# Patient Record
Sex: Male | Born: 1980 | Race: White | Hispanic: No | Marital: Married | Smoking: Former smoker
Health system: Southern US, Community
[De-identification: ages and names within clinical notes are randomized; demographics above are authoritative.]

## PROBLEM LIST (undated history)

## (undated) DIAGNOSIS — R002 Palpitations: Secondary | ICD-10-CM

## (undated) HISTORY — DX: Palpitations: R00.2

---

## 2014-07-10 ENCOUNTER — Emergency Department (HOSPITAL_COMMUNITY)
Admission: EM | Admit: 2014-07-10 | Discharge: 2014-07-11 | Disposition: A | Discharge status: Home / Self Care | Payer: BC Managed Care – PPO | Attending: Emergency Medicine | Admitting: Emergency Medicine

## 2014-07-10 ENCOUNTER — Encounter (HOSPITAL_COMMUNITY): Payer: Self-pay | Admitting: Emergency Medicine

## 2014-07-10 ENCOUNTER — Emergency Department (HOSPITAL_COMMUNITY): Payer: BC Managed Care – PPO

## 2014-07-10 DIAGNOSIS — T44901A Poisoning by unspecified drugs primarily affecting the autonomic nervous system, accidental (unintentional), initial encounter: Secondary | ICD-10-CM

## 2014-07-10 DIAGNOSIS — T44991A Poisoning by other drug primarily affecting the autonomic nervous system, accidental (unintentional), initial encounter: Secondary | ICD-10-CM | POA: Diagnosis not present

## 2014-07-10 DIAGNOSIS — R000 Tachycardia, unspecified: Secondary | ICD-10-CM | POA: Insufficient documentation

## 2014-07-10 DIAGNOSIS — R002 Palpitations: Secondary | ICD-10-CM | POA: Diagnosis present

## 2014-07-10 LAB — BASIC METABOLIC PANEL
Anion gap: 11 (ref 5–15)
BUN: 12 mg/dL (ref 6–23)
CALCIUM: 9.6 mg/dL (ref 8.4–10.5)
CO2: 26 mmol/L (ref 19–32)
CREATININE: 0.99 mg/dL (ref 0.50–1.35)
Chloride: 100 mmol/L (ref 96–112)
Glucose, Bld: 149 mg/dL — ABNORMAL HIGH (ref 70–99)
Potassium: 3.6 mmol/L (ref 3.5–5.1)
SODIUM: 137 mmol/L (ref 135–145)

## 2014-07-10 LAB — CBC
HCT: 43.9 % (ref 39.0–52.0)
Hemoglobin: 15.1 g/dL (ref 13.0–17.0)
MCH: 29.4 pg (ref 26.0–34.0)
MCHC: 34.4 g/dL (ref 30.0–36.0)
MCV: 85.4 fL (ref 78.0–100.0)
Platelets: 269 10*3/uL (ref 150–400)
RBC: 5.14 MIL/uL (ref 4.22–5.81)
RDW: 12.6 % (ref 11.5–15.5)
WBC: 11.5 10*3/uL — ABNORMAL HIGH (ref 4.0–10.5)

## 2014-07-10 LAB — I-STAT TROPONIN, ED: Troponin i, poc: 0 ng/mL (ref 0.00–0.08)

## 2014-07-10 NOTE — ED Notes (Signed)
Pt presents with palpitations throughout the day, denies CP.  Pt reports that he has recently had a chest cold and cough- sinus tach on monitor.  Denies N/V.

## 2014-07-11 LAB — TSH: TSH: 2.24 u[IU]/mL (ref 0.350–4.500)

## 2014-07-11 MED ORDER — SODIUM CHLORIDE 0.9 % IV BOLUS (SEPSIS)
1000.0000 mL | Freq: Once | INTRAVENOUS | Status: AC
  Administered 2014-07-11: 1000 mL via INTRAVENOUS

## 2014-07-11 NOTE — Discharge Instructions (Signed)
Palpitations °A palpitation is the feeling that your heartbeat is irregular or is faster than normal. It may feel like your heart is fluttering or skipping a beat. Palpitations are usually not a serious problem. However, in some cases, you may need further medical evaluation. °CAUSES  °Palpitations can be caused by: °· Smoking. °· Caffeine or other stimulants, such as diet pills or energy drinks. °· Alcohol. °· Stress and anxiety. °· Strenuous physical activity. °· Fatigue. °· Certain medicines. °· Heart disease, especially if you have a history of irregular heart rhythms (arrhythmias), such as atrial fibrillation, atrial flutter, or supraventricular tachycardia. °· An improperly working pacemaker or defibrillator. °DIAGNOSIS  °To find the cause of your palpitations, your health care provider will take your medical history and perform a physical exam. Your health care provider may also have you take a test called an ambulatory electrocardiogram (ECG). An ECG records your heartbeat patterns over a 24-hour period. You may also have other tests, such as: °· Transthoracic echocardiogram (TTE). During echocardiography, sound waves are used to evaluate how blood flows through your heart. °· Transesophageal echocardiogram (TEE). °· Cardiac monitoring. This allows your health care provider to monitor your heart rate and rhythm in real time. °· Holter monitor. This is a portable device that records your heartbeat and can help diagnose heart arrhythmias. It allows your health care provider to track your heart activity for several days, if needed. °· Stress tests by exercise or by giving medicine that makes the heart beat faster. °TREATMENT  °Treatment of palpitations depends on the cause of your symptoms and can vary greatly. Most cases of palpitations do not require any treatment other than time, relaxation, and monitoring your symptoms. Other causes, such as atrial fibrillation, atrial flutter, or supraventricular  tachycardia, usually require further treatment. °HOME CARE INSTRUCTIONS  °· Avoid: °¨ Caffeinated coffee, tea, soft drinks, diet pills, and energy drinks. °¨ Chocolate. °¨ Alcohol. °· Stop smoking if you smoke. °· Reduce your stress and anxiety. Things that can help you relax include: °¨ A method of controlling things in your body, such as your heartbeats, with your mind (biofeedback). °¨ Yoga. °¨ Meditation. °¨ Physical activity such as swimming, jogging, or walking. °· Get plenty of rest and sleep. °SEEK MEDICAL CARE IF:  °· You continue to have a fast or irregular heartbeat beyond 24 hours. °· Your palpitations occur more often. °SEEK IMMEDIATE MEDICAL CARE IF: °· You have chest pain or shortness of breath. °· You have a severe headache. °· You feel dizzy or you faint. °MAKE SURE YOU: °· Understand these instructions. °· Will watch your condition. °· Will get help right away if you are not doing well or get worse. °Document Released: 05/02/2000 Document Revised: 05/10/2013 Document Reviewed: 07/04/2011 °ExitCare® Patient Information ©2015 [employer]. This information is not intended to replace advice given to you by your health care provider. Make sure you discuss any questions you have with your health care provider. ° ° °Emergency Department Resource Guide °1) Find a Doctor and Pay Out of Pocket °Although you won't have to find out who is covered by your insurance plan, it is a good idea to ask around and get recommendations. You will then need to call the office and see if the doctor you have chosen will accept you as a new patient and what types of options they offer for patients who are self-pay. Some doctors offer discounts or will set up payment plans for their patients who do not have insurance, but   you will need to ask so you aren't surprised when you get to your appointment. ° °2) Contact Your Local Health Department °Not all health departments have doctors that can see patients for sick visits, but  many do, so it is worth a call to see if yours does. If you don't know where your local health department is, you can check in your phone book. The CDC also has a tool to help you locate your state's health department, and many state websites also have listings of all of their local health departments. ° °3) Find a Walk-in Clinic °If your illness is not likely to be very severe or complicated, you may want to try a walk in clinic. These are popping up all over the country in pharmacies, drugstores, and shopping centers. They're usually staffed by nurse practitioners or physician assistants that have been trained to treat common illnesses and complaints. They're usually fairly quick and inexpensive. However, if you have serious medical issues or chronic medical problems, these are probably not your best option. ° °No Primary Care Doctor: °- Call [facility] at  832-8000 - they can help you locate a primary care doctor that  accepts your insurance, provides certain services, etc. °- Physician Referral Service- 1-800-533-3463 ° °Chronic Pain Problems: °Organization         Address  Phone   Notes  °[facility]  (336) 297-2271 Patients need to be referred by their primary care doctor.  ° °Medication Assistance: °Organization         Address  Phone   Notes  °[place] [facility] [address] °[place], [place] [address] (336) 641-8030 --Must be a resident of [place] °-- Must have NO insurance coverage whatsoever (no Medicaid/ Medicare, etc.) °-- The pt. MUST have a primary care doctor that directs their care regularly and follows them in the community °  °[facility]  (866) 331-1348   °[employer]  (888) 892-1162   ° °Agencies that provide inexpensive medical care: °Organization         Address  Phone   Notes  °[facility]  (336) 832-8035   °[facility]    (336) 832-7272   °[facility] [address] °[place], [place] [address] (336) 832-4777   °[facility] [address], °[place] (336) 271-4999   °[facility]    (336) 373-0678   °[facility]    (336) 272-1050   °[facility] ° [address], [place] Phone:  (336) 832-4444, Fax:  (336) 832-4440 Hours of Operation:  9 am - 6 pm, M-F.  Also accepts Medicaid/Medicare and self-pay.  °[facility] ° [address], [place] Phone: (336) 832-3150, Fax: (336) 832-3151. Hours of Operation:  8:30 am - 5:30 pm, M-F.  Also accepts Medicaid and self-pay.  °[facility] [place] [address], [place] Phone: (336) 878-6027   °[facility] [address], [place], [place] (336)723-1848, Ext. 123 Mondays & Thursdays: 7-9 AM.  First 15 patients are seen on a first come, first serve basis. °  ° °Medicaid-accepting [place] Providers: ° °Organization         Address  Phone   Notes  °[facility] [address], [place] (336) 641-2100 Also accepts self-pay patients.  °[facility] [address], [place] ° (336) 856-9996   °[facility] [address], [place] (336) 288-8857   °[facility] [address] [place] [address], [place] (336) 299-7000   °Veita Bland [address], [place]  ° (336) 373-1557 Only accepts [place] Access Medicaid patients after they have their name applied to their card.  ° °Self-Pay (no insurance) in [place]: ° °Organization         Address  Phone   Notes  °Sickle Cell Patients, [facility] [address], [place] (336) 832-1970   °[facility] [address], [place] (336) 832-4400   °[facility] ° [address], [place] (336) 992-4800   °[facility]/Dr. Osei-Bonsu ° [address] [place] [address], [place] or  [address], [place] (336) 841-8500 Phone number for both [place] and [place] locations is the same.  °[facility] [address], [place] (336) 299-0000   °[facility] [address] [place] [address], [place] or [address] (336) 852-7530 °(336) 878-2260   °[facility] [address], [place] (336) 350-1642, phone; (336) 294-5005, fax Sees patients 1st and 3rd Saturday of every month.  Must not qualify for public or private insurance (i.e. Medicaid, Medicare, [place] Health Choice, Veterans' Benefits) • Household income should be no more than 200% of the poverty level •The clinic cannot treat you if you are pregnant or think you are pregnant • Sexually transmitted diseases are not treated at the clinic.  ° ° °Dental Care: °Organization         Address  Phone  Notes  °[facility] [address], [place] (336) 641-6152 Accepts children up to age 21 who are enrolled in Medicaid or [place] Health Choice; pregnant women with a Medicaid card; and children who have applied for Medicaid or [place] Health Choice, but were declined, whose parents can pay a reduced fee at time of service.  °[facility] [place]  [address], [place] (336) 641-7733 Accepts children up to age 21 who are enrolled in Medicaid or [place] Health Choice; pregnant women with a Medicaid card; and children who have applied for Medicaid or  Health Choice, but were declined, whose parents can pay a reduced fee at time of service.  °[facility] ° [address], [place] (336) 641-4533 Patients are seen by appointment only. Walk-ins are not accepted. [facility] will see patients 18 years of age and older. °Monday - Tuesday (8am-5pm) °Most Wednesdays (8:30-5pm) °$30 per visit, cash only  °[facility] ° [address], [place] (336) 641-4533 Patients are seen by appointment only. Walk-ins are not accepted. [facility] will see patients 18 years of age and older. °One Wednesday Evening (Monthly: Volunteer Based).  $30 per visit, cash only  °[facility]  (919) 537-3737 for adults; Children under age 4, call Graduate Pediatric Dentistry at (919) [phone]. Children aged 4-14, please call (919) [phone] to request a pediatric application. ° Dental services are provided in all areas of dental care including fillings, crowns and bridges, complete and partial dentures, implants, gum treatment, root canals, and extractions. Preventive care is also provided. Treatment is provided to both adults and children. °Patients are selected via a lottery   and there is often a waiting list. °  °[facility] [address], °[place] ° (336) 763-8833 [email or web address] °  °[facility] [address], [place], [place] (336)723-1848, Ext. 123 Second and Fourth Thursday of each month, opens at 6:30 AM; Clinic ends at 9 AM.  Patients are seen on a first-come first-served basis, and a limited number are seen during each clinic.  ° °[facility] ° [address], [place], [place] (336) 723-7904   Eligibility Requirements °You must have lived in [place], [place], or [place] counties for at least the last three months. °  You cannot be eligible for state or federal sponsored healthcare insurance, including Veterans Administration, Medicaid, or Medicare. °  You generally cannot be eligible for healthcare insurance through your employer.  °  How to apply: °Eligibility screenings are held every Tuesday and Wednesday afternoon from 1:00 pm until 4:00 pm. You do not need an appointment for the interview!  °[facility] [address], [place], [place] 336-631-2330   °[facility]  336-342-8273   °[facility]  336-703-3100   °[facility]  336-570-6415   ° °Behavioral Health Resources in the Community: °Intensive Outpatient Programs °Organization         Address  Phone  Notes  °[facility] [address], [place], [place] 336-878-6098   °[facility] [address], [place], [place] 336-832-9800   °[facility] [address], [place], [place] ° 336-882-2125   °[facility] [address],  °[place], [place] 1-800-853-5163 or 336-641-4981   °Substance Abuse Resources °Organization         Address  Phone  Notes  °[facility]  336-882-2125   °[facility]  336-784-9470   °[facility]  336-285-9073   °[facility]  336-845-3988   °Residential & Outpatient Substance Abuse Program  1-800-659-3381   °Psychological Services °Organization         Address  Phone  Notes  °[facility]  336- 832-9600   °[facility]  336- 378-7881   °[facility] [address], [place] 1-800-853-5163 or 336-641-4981   ° °Mobile Crisis Teams °Organization         Address  Phone  Notes  °[facility], Mobile Crisis Care Unit  1-877-626-1772   °Assertive °[facility] ° [address] [place], [place] 336-834-9664   °Sharon DeEsch [address] °[place] 336-554-5454   ° °Self-Help/Support Groups °Organization         Address  Phone             Notes  °[facility] - variety of support groups  336- 373-1402 Call for more information  °Narcotics Anonymous (NA), [facility] [address], °[place]  2 meetings at this location  ° °Residential Treatment Programs °Organization         Address  Phone  Notes  °[facility] [address],    °[place]  1-866-801-8205   °[facility] ° [address], [place], [place] 704-293-8524   °[facility] [address], [place] 336-845-3988  Admissions: 8am-3pm M-F  °[facility] [address]    °[place], [place] 336-841-1104   °[facility] [address], ,   [place] 336-379-7146   °[facility] [address]  °[place], [place] 336-285-9073   °[facility] - Intensive Outpatient [address], [place], [place] 336-852-3033   °[facility] [address]  °[place], [place] 1-877-615-2722 or 336-784-9470   °[facility] ([facility] [address] [place], [place] 336-227-7417 Accepts Medicaid  °[facility] [address]  °[place] 1-800-659-3381 Substance Abuse/Addiction Treatment  ° °[place] Behavioral Health Resources °Organization         Address  Phone  Notes  °[facility]  (888) 581-9988   °Julie Brannon, PhD [address] [place], [place]   (336) 349-5553 or (336) 951-0000   °[facility]   [address] °[place], [place] (336) 349-4454   °[facility] [address], [place], [place] (336) 342-8316 Insurance/Medicaid/sponsorship through [facility]  °[facility] [address]                                    [place], [place] (336) 342-8316 Therapy/tele-psych/case  °[facility] [address]  ° [place], [place] (336) 349-2233    °Dr. Arfeen  (336) 349-4544   °[facility] [place]  [employer] [place] [facility] 1) [address], [place] °2) [address], [place] °3)  [address], [place] (336) 349-3220 °(336) 342-7768 ° °(336) 342-8140   °[place] [facility] (336) 342-1394 or (336) 342-3537 (After Hours)    ° ° ° °

## 2014-07-11 NOTE — ED Provider Notes (Signed)
CSN: [id number]     Arrival date & time 07/10/14  1945 History   First MD Initiated Contact with Patient 07/10/14 2338     Chief Complaint  Patient presents with  . Palpitations     (Consider location/radiation/quality/duration/timing/severity/associated sxs/prior Treatment) HPI The patient reports he noticed his heart rate to be fast this afternoon. He denies he had chest pain or shortness of breath in association with this. He denies he had any syncope or near syncope. He denies any history of having similar symptoms. The patient did have 4 cups of coffee this morning. He also reports that he had a half of a Pepsi around lunchtime. He took a pseudoephedrine tablet around lunchtime as well for some cold symptoms that he has been developing, then later afternoon about the time he got home he had a Dr. Reino Kent and took some Excedrin migraine tablets. The patient reports he had some mild cold symptoms that both his daughter and his wife had had with nasal congestion and stuffiness with a mild cough. Family history is negative for early onset cardiac disease, sudden death or dysrhythmia. History reviewed. No pertinent past medical history. History reviewed. No pertinent past surgical history. No family history on file. History  Substance Use Topics  . Smoking status: Never Smoker   . Smokeless tobacco: Not on file  . Alcohol Use: Yes    Review of Systems 10 Systems reviewed and are negative for acute change except as noted in the HPI.    Allergies  Review of patient's allergies indicates no known allergies.  Home Medications   Prior to Admission medications   Medication Sig Start Date End Date Taking? Authorizing Provider  Aspirin-Acetaminophen-Caffeine (EXCEDRIN PO) Take 2 tablets by mouth daily as needed (headache).   Yes Historical Provider, MD  Pseudoephedrine HCl (SUDAFED PO) Take 1 tablet by mouth daily as needed (congestion).   Yes Historical Provider, MD   BP 149/88 mmHg   Pulse 97  Temp(Src) 98.7 F (37.1 C)  Resp 10  Ht  (1.727 m)  Wt 240 lb (108.863 kg)  BMI 36.50 kg/m2  SpO2 99% Physical Exam  Constitutional: He is oriented to person, place, and time. He appears well-developed and well-nourished.  HENT:  Head: Normocephalic and atraumatic.  Eyes: EOM are normal. Pupils are equal, round, and reactive to light.  Neck: Neck supple.  Cardiovascular: Regular rhythm, normal heart sounds and intact distal pulses.   Tachycardia 103  Pulmonary/Chest: Effort normal and breath sounds normal.  Abdominal: Soft. Bowel sounds are normal. He exhibits no distension. There is no tenderness.  Musculoskeletal: Normal range of motion. He exhibits no edema.  Neurological: He is alert and oriented to person, place, and time. He has normal strength. Coordination normal. GCS eye subscore is 4. GCS verbal subscore is 5. GCS motor subscore is 6.  Skin: Skin is warm, dry and intact.  Psychiatric: He has a normal mood and affect.    ED Course  Procedures (including critical care time) Labs Review Labs Reviewed  CBC - Abnormal; Notable for the following:    WBC 11.5 (*)    All other components within normal limits  BASIC METABOLIC PANEL - Abnormal; Notable for the following:    Glucose, Bld 149 (*)    All other components within normal limits  TSH  T3, FREE  I-STAT TROPOININ, ED    Imaging Review Dg Chest 2 View  07/10/2014   CLINICAL DATA:  Rapid heart rate and palpitations since this afternoon.  EXAM: CHEST  2 VIEW  COMPARISON:  None.  FINDINGS: The heart size and mediastinal contours are within normal limits. Both lungs are clear. The visualized skeletal structures are unremarkable.  IMPRESSION: No active cardiopulmonary disease.   Electronically Signed   By: Burman NievesWilliam  Stevens M.D.   On: 07/10/2014 20:40     EKG Interpretation   Date/Time:  Monday July 10 2014 19:49:36 EST Ventricular Rate:  130 PR Interval:  138 QRS Duration: 90 QT Interval:   310 QTC Calculation: 456 R Axis:   55 Text Interpretation:  Sinus tachycardia Otherwise normal ECG No old  tracing to compare Confirmed by [facility]  MD, DAVID ([id number]) on 07/10/2014  7:56:16 PM      MDM   Final diagnoses:  Palpitations  Overdose of sympathomimetic agent, accidental or unintentional, initial encounter   Patient's symptoms are secondary to over consumption of stimulants today. He has not had any associated chest pain, shortness of breath or syncope. At this time I do feel he is safe for discharge to follow up with his family physician and avoidance of sympathomimetics and caffeine.    Arby BarretteMarcy Johnny Gorter, MD 07/11/14 [phone]

## 2014-07-11 NOTE — ED Notes (Addendum)
Pt reports palpitations that began this afternoon, reports he has had some cold symptoms so he took some sudafed and an excedrin migraine today and also reports he has had a few cups of coffee and a soda as well. Denies chest pain or Sob. Pt alert, oriented, respirations equal and unlabored, nad.

## 2014-07-12 LAB — T3, FREE: T3, Free: 3.6 pg/mL (ref 2.0–4.4)

## 2014-08-30 ENCOUNTER — Ambulatory Visit (INDEPENDENT_AMBULATORY_CARE_PROVIDER_SITE_OTHER): Payer: BC Managed Care – PPO | Admitting: Cardiology

## 2014-08-30 ENCOUNTER — Encounter: Payer: Self-pay | Admitting: Cardiology

## 2014-08-30 VITALS — BP 100/70 | HR 90 | Ht 68.0 in | Wt 228.8 lb

## 2014-08-30 DIAGNOSIS — Z8249 Family history of ischemic heart disease and other diseases of the circulatory system: Secondary | ICD-10-CM

## 2014-08-30 DIAGNOSIS — R002 Palpitations: Secondary | ICD-10-CM

## 2014-08-30 DIAGNOSIS — R7309 Other abnormal glucose: Secondary | ICD-10-CM | POA: Diagnosis not present

## 2014-08-30 DIAGNOSIS — E785 Hyperlipidemia, unspecified: Secondary | ICD-10-CM

## 2014-08-30 LAB — HEMOGLOBIN A1C: Hgb A1c MFr Bld: 5.5 % (ref 4.6–6.5)

## 2014-08-30 NOTE — Progress Notes (Signed)
Patient ID: Paul Wolf, male   DOB: 25-May-1980, 34 y.o.   MRN: [id number]      Cardiology Office Note   Date:  08/30/2014   ID:  Paul Wolf, DOB 1980/06/23, MRN [id number]  PCP:  No primary care provider on file.  Cardiologist: Lars Masson, MD   Chief complain: Palpitations   History of Present Illness: Paul Wolf is a 34 y.o. male who presents for evaluation of palpitations. The patient presented to the ER in February of these year with palpitations. At the time patient had upper respiratory infection, had multiple coffees and used oral decongestant. In the ER his EKG showed sinus tachycardia with ventricular rate of 130. 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 He is a 34-year-old is very busy and doesn't exercise. His major concern is that on several occasion his heart rate runs about 110 and is associated palpitations. He denies any associated chest pain or shortness of breath with days no presyncope or syncope in the past. He denies use of any drugs or energy drinks. In his family his father has history of atrial fibrillation otherwise his grandfather and his brother had myocardial infarction in 96s.  Past Medical History  Diagnosis Date  . Palpitations     No past surgical history on file.   Current Outpatient Prescriptions  Medication Sig Dispense Refill  . FIBER SELECT GUMMIES PO Take by mouth.     No current facility-administered medications for this visit.    Allergies:   Review of patient's allergies indicates no known allergies.    Social History:  The patient  reports that he has been smoking Cigars.  He does not have any smokeless tobacco history on file. He reports that he drinks alcohol.   Family History:  The patient's family history includes Skin cancer in his father.    ROS:  Please see the history of  present illness.   Otherwise, review of systems are positive for none.   All other systems are reviewed and negative.    PHYSICAL EXAM: VS:  BP 100/70 mmHg  Pulse 90  Ht  (1.727 m)  Wt 228 lb 12.8 oz (103.783 kg)  BMI 34.80 kg/m2  SpO2 97% , BMI Body mass index is 34.8 kg/(m^2). GEN: Well nourished, well developed, in no acute distress HEENT: normal Neck: no JVD, carotid bruits, or masses Cardiac: RRR; no murmurs, rubs, or gallops,no edema  Respiratory:  clear to auscultation bilaterally, normal work of breathing GI: soft, nontender, nondistended, + BS MS: no deformity or atrophy Skin: warm and dry, no rash Neuro:  Strength and sensation are intact Psych: euthymic mood, full affect   EKG:  EKG, SR, norma ECG   Recent Labs: 07/10/2014: BUN 12; Creatinine 0.99; Hemoglobin 15.1; Platelets 269; Potassium 3.6; Sodium 137 07/11/2014: TSH 2.240    Lipid Panel No results found for: CHOL, TRIG, HDL, CHOLHDL, VLDL, LDLCALC, LDLDIRECT    Wt Readings from Last 3 Encounters:  08/30/14 228 lb 12.8 oz (103.783 kg)  07/10/14 240 lb (108.863 kg)      Other studies Reviewed: Additional studies/ records that were reviewed today include: EKG from the ER, records from his primary care physician.    ASSESSMENT AND PLAN:  34 year old gentleman  1. Palpitations - the episode in the ER can be contributed to acute infectious disease - upper respiratory infection with  mild leukocytosis, use of all fibrin energy drugs for nasal decongestion and possible dehydration at the time. The concern is syndrome of inappropriate sinus tachycardia. We will order a 48-hour Holter monitor to evaluate for heart rate during the day and night. We will also evaluate for possible other arrhythmia such as atrial fibrillation. Patient's labs performed the ER showed normal electrolytes normal kidney function and normal TSH including free T3. The patient had elevated glucose of 149. For now patient is advised to  increase his exercise level, hydrated.  2. Hyperlipidemia - the patient had elevated triglycerides in the past, since he has a family history of coronary artery disease in his grandfather we'll repeat today.  3. Elevated blood glucose - concerning for the patient, considering his physical inactivity and obesity who check hemoglobin A1c today.   Current medicines are reviewed at length with the patient today.  The patient does not have concerns regarding medicines.  The following changes have been made:  no change  Labs/ tests ordered today include:  Orders Placed This Encounter  Procedures  . NMR Lipoprofile with Lipids  . Lipoprotein A (LPA)  . HgB A1c  . Holter monitor - 48 hour     Disposition:   FU with Monzerrat Wellen H in 2 months.   Signed, Lars MassonNELSON, Charnae Lill H, MD  08/30/2014 10:39 AM    [facility] [address], [place], [place]  [address] Phone: [phone]; Fax: [phone]

## 2014-08-30 NOTE — Patient Instructions (Addendum)
Medication Instructions:  Your physician recommends that you continue on your current medications as directed. Please refer to the Current Medication list given to you today.   Your physician recommends that you return for lab work in: TODAY-  NMR WITH LIPIDS, LIPO A, AND HEMOGLOBIN A1C   Testing/Procedures:  Your physician has recommended that you wear a 48 HOUR holter monitor. Holter monitors are medical devices that record the heart's electrical activity. Doctors most often use these monitors to diagnose arrhythmias. Arrhythmias are problems with the speed or rhythm of the heartbeat. The monitor is a small, portable device. You can wear one while you do your normal daily activities. This is usually used to diagnose what is causing palpitations/syncope (passing out).    Follow-Up:  Your physician recommends that you schedule a follow-up appointment in: WITH DR NELSON BASED ON YOUR HOLTER MONITOR RESULTS

## 2014-08-31 ENCOUNTER — Encounter: Payer: Self-pay | Admitting: *Deleted

## 2014-08-31 ENCOUNTER — Encounter (INDEPENDENT_AMBULATORY_CARE_PROVIDER_SITE_OTHER): Payer: BC Managed Care – PPO

## 2014-08-31 DIAGNOSIS — R002 Palpitations: Secondary | ICD-10-CM | POA: Diagnosis not present

## 2014-08-31 LAB — LIPOPROTEIN A (LPA): Lipoprotein (a): 7 mg/dL (ref 0–30)

## 2014-08-31 NOTE — Progress Notes (Signed)
Patient ID: Paul DecreeJeffrey Wolf, male   DOB: Feb 17, 1981, 34 y.o.   MRN: [id number] [employer] 48 hour holter monitor applied to patient.

## 2014-09-01 LAB — NMR LIPOPROFILE WITH LIPIDS
Cholesterol, Total: 209 mg/dL — ABNORMAL HIGH (ref 100–199)
HDL Particle Number: 26.5 umol/L — ABNORMAL LOW (ref >=30.5)
HDL Size: 8.1 nm — ABNORMAL LOW (ref >=9.2)
HDL-C: 34 mg/dL — ABNORMAL LOW (ref >39)
LDL (calc): 139 mg/dL — ABNORMAL HIGH (ref 0–99)
LDL Particle Number: 2041 nmol/L — ABNORMAL HIGH (ref <1000)
LDL Size: 20 nm (ref >=20.8)
LP-IR Score: 76 — ABNORMAL HIGH (ref <=45)
Large HDL-P: <1.3 umol/L — ABNORMAL LOW (ref >=4.8)
Large VLDL-P: 5.3 nmol/L — ABNORMAL HIGH (ref <=2.7)
Small LDL Particle Number: 1205 nmol/L — ABNORMAL HIGH (ref <=527)
Triglycerides: 181 mg/dL — ABNORMAL HIGH (ref 0–149)
VLDL Size: 47.9 nm — ABNORMAL HIGH (ref <=46.6)

## 2014-09-05 ENCOUNTER — Encounter: Payer: Self-pay | Admitting: Cardiology

## 2014-09-06 ENCOUNTER — Telehealth: Payer: Self-pay | Admitting: Cardiology

## 2014-09-06 DIAGNOSIS — E782 Mixed hyperlipidemia: Secondary | ICD-10-CM

## 2014-09-06 DIAGNOSIS — E785 Hyperlipidemia, unspecified: Secondary | ICD-10-CM

## 2014-09-06 MED ORDER — PRAVASTATIN SODIUM 10 MG PO TABS: 10.0000 mg | ORAL_TABLET | Freq: Every day | ORAL | Status: DC

## 2014-09-06 NOTE — Telephone Encounter (Signed)
Contacted the pt to inform him that per Dr Delton SeeNelson his LDL and Triglycerides are significantly elevated, and she recommends the pt to start taking pravastatin 10 mg po daily and repeat lipids and cmet in 2 months.  Confirmed the pharmacy of choice with the pt.  Lab appt scheduled for 11/06/14 at our office to check lipids and cmet.  Pt is aware to come fasting.  Pt verbalized understanding and agrees with this plan.

## 2014-09-06 NOTE — Telephone Encounter (Signed)
New Message ° ° ° ° ° ° ° °Pt returning phone call. Please call back and advise. °

## 2014-09-06 NOTE — Telephone Encounter (Signed)
-----   Message from Lars MassonKatarina H Nelson, MD sent at 09/05/2014 10:14 PM EDT ----- This patient has significantly elevated LDL and TG, I would suggest to start low dose pravastatin 10 mg po daily and repeat lipids with CMP in 2 months.

## 2014-09-13 ENCOUNTER — Telehealth: Payer: Self-pay | Admitting: *Deleted

## 2014-09-13 NOTE — Telephone Encounter (Signed)
Contacted the pt to inform him that per Dr Delton SeeNelson his 24 hour holter monitor was completely normal.  Pt verbalized understanding.

## 2014-11-06 ENCOUNTER — Other Ambulatory Visit (INDEPENDENT_AMBULATORY_CARE_PROVIDER_SITE_OTHER): Payer: BC Managed Care – PPO | Admitting: *Deleted

## 2014-11-06 DIAGNOSIS — E785 Hyperlipidemia, unspecified: Secondary | ICD-10-CM | POA: Diagnosis not present

## 2014-11-06 DIAGNOSIS — E782 Mixed hyperlipidemia: Secondary | ICD-10-CM

## 2014-11-06 LAB — COMPREHENSIVE METABOLIC PANEL
ALT: 16 U/L (ref 0–53)
AST: 15 U/L (ref 0–37)
Albumin: 4.4 g/dL (ref 3.5–5.2)
Alkaline Phosphatase: 53 U/L (ref 39–117)
BUN: 13 mg/dL (ref 6–23)
CO2: 29 mEq/L (ref 19–32)
Calcium: 9.6 mg/dL (ref 8.4–10.5)
Chloride: 102 mEq/L (ref 96–112)
Creatinine, Ser: 0.92 mg/dL (ref 0.40–1.50)
GFR: 99.99 mL/min (ref >60.00)
Glucose, Bld: 93 mg/dL (ref 70–99)
Potassium: 4 mEq/L (ref 3.5–5.1)
Sodium: 135 mEq/L (ref 135–145)
Total Bilirubin: 0.4 mg/dL (ref 0.2–1.2)
Total Protein: 7.4 g/dL (ref 6.0–8.3)

## 2014-11-06 LAB — LIPID PANEL
Cholesterol: 173 mg/dL (ref 0–200)
HDL: 38.8 mg/dL — ABNORMAL LOW (ref >39.00)
LDL Cholesterol: 99 mg/dL (ref 0–99)
NonHDL: 134.2
Total CHOL/HDL Ratio: 4
Triglycerides: 174 mg/dL — ABNORMAL HIGH (ref 0.0–149.0)
VLDL: 34.8 mg/dL (ref 0.0–40.0)

## 2015-07-13 ENCOUNTER — Other Ambulatory Visit: Payer: Self-pay | Admitting: Cardiology

## 2015-07-13 DIAGNOSIS — E785 Hyperlipidemia, unspecified: Secondary | ICD-10-CM

## 2015-07-13 DIAGNOSIS — E782 Mixed hyperlipidemia: Secondary | ICD-10-CM

## 2015-07-13 MED ORDER — PRAVASTATIN SODIUM 10 MG PO TABS: 10.0000 mg | ORAL_TABLET | Freq: Every day | ORAL | Status: DC

## 2015-09-12 ENCOUNTER — Other Ambulatory Visit: Payer: Self-pay | Admitting: Cardiology

## 2015-12-11 ENCOUNTER — Telehealth: Payer: Self-pay | Admitting: Cardiology

## 2015-12-11 NOTE — Telephone Encounter (Signed)
New message    *STAT* If patient is at the pharmacy, call can be transferred to refill team.   1. Which medications need to be refilled? (please list name of each medication and dose if known) prevastatin 10 mg  2. Which pharmacy/location (including street and city if local pharmacy) is medication to be sent to[facility]  3. Do they need a 30 day or 90 day supply? 90

## 2015-12-12 MED ORDER — PRAVASTATIN SODIUM 10 MG PO TABS: 10.0000 mg | ORAL_TABLET | Freq: Every day | ORAL | 0 refills | Status: DC

## 2016-02-06 ENCOUNTER — Encounter: Payer: Self-pay | Admitting: Cardiology

## 2016-02-21 ENCOUNTER — Ambulatory Visit (INDEPENDENT_AMBULATORY_CARE_PROVIDER_SITE_OTHER): Payer: BC Managed Care – PPO | Admitting: Cardiology

## 2016-02-21 ENCOUNTER — Encounter: Payer: Self-pay | Admitting: Cardiology

## 2016-02-21 VITALS — BP 132/76 | HR 76 | Ht 68.0 in | Wt 241.0 lb

## 2016-02-21 DIAGNOSIS — R7309 Other abnormal glucose: Secondary | ICD-10-CM

## 2016-02-21 DIAGNOSIS — E782 Mixed hyperlipidemia: Secondary | ICD-10-CM

## 2016-02-21 DIAGNOSIS — R002 Palpitations: Secondary | ICD-10-CM | POA: Diagnosis not present

## 2016-02-21 HISTORY — DX: Mixed hyperlipidemia: E78.2

## 2016-02-21 LAB — COMPREHENSIVE METABOLIC PANEL
ALT: 26 U/L (ref 9–46)
AST: 22 U/L (ref 10–40)
Albumin: 4.6 g/dL (ref 3.6–5.1)
Alkaline Phosphatase: 46 U/L (ref 40–115)
BUN: 14 mg/dL (ref 7–25)
CO2: 29 mmol/L (ref 20–31)
Calcium: 9.8 mg/dL (ref 8.6–10.3)
Chloride: 104 mmol/L (ref 98–110)
Creat: 1.01 mg/dL (ref 0.60–1.35)
Glucose, Bld: 98 mg/dL (ref 65–99)
Potassium: 4.9 mmol/L (ref 3.5–5.3)
Sodium: 139 mmol/L (ref 135–146)
Total Bilirubin: 0.6 mg/dL (ref 0.2–1.2)
Total Protein: 7.2 g/dL (ref 6.1–8.1)

## 2016-02-21 LAB — CBC WITH DIFFERENTIAL/PLATELET
Basophils Absolute: 66 cells/uL (ref 0–200)
Basophils Relative: 1 %
Eosinophils Absolute: 132 cells/uL (ref 15–500)
Eosinophils Relative: 2 %
HCT: 43.4 % (ref 38.5–50.0)
Hemoglobin: 14.6 g/dL (ref 13.2–17.1)
Lymphocytes Relative: 34 %
Lymphs Abs: 2244 cells/uL (ref 850–3900)
MCH: 29.6 pg (ref 27.0–33.0)
MCHC: 33.6 g/dL (ref 32.0–36.0)
MCV: 88 fL (ref 80.0–100.0)
MPV: 8.7 fL (ref 7.5–12.5)
Monocytes Absolute: 594 cells/uL (ref 200–950)
Monocytes Relative: 9 %
Neutro Abs: 3564 cells/uL (ref 1500–7800)
Neutrophils Relative %: 54 %
Platelets: 246 10*3/uL (ref 140–400)
RBC: 4.93 MIL/uL (ref 4.20–5.80)
RDW: 13.2 % (ref 11.0–15.0)
WBC: 6.6 10*3/uL (ref 3.8–10.8)

## 2016-02-21 LAB — LIPID PANEL
Cholesterol: 197 mg/dL (ref 125–200)
HDL: 40 mg/dL (ref >=40)
LDL Cholesterol: 124 mg/dL (ref <130)
Total CHOL/HDL Ratio: 4.9 Ratio (ref <=5.0)
Triglycerides: 163 mg/dL — ABNORMAL HIGH (ref <150)
VLDL: 33 mg/dL — ABNORMAL HIGH (ref <30)

## 2016-02-21 LAB — TSH: TSH: 2.16 mIU/L (ref 0.40–4.50)

## 2016-02-21 NOTE — Patient Instructions (Signed)
Medication Instructions:   Your physician recommends that you continue on your current medications as directed. Please refer to the Current Medication list given to you today.    Labwork:  TODAY--CMET, TSH, CBC W DIFF, AND LIPIDS    Follow-Up:  Your physician wants you to follow-up in: ONE YEAR WITH DR Johnell ComingsNELSON You will receive a reminder letter in the mail two months in advance. If you don't receive a letter, please call our office to schedule the follow-up appointment.        If you need a refill on your cardiac medications before your next appointment, please call your pharmacy.

## 2016-02-21 NOTE — Progress Notes (Signed)
Patient ID: Paul Wolf, male   DOB: [birth year], 35 y.o.   MRN: [id number]      Cardiology Office Note   Date:  02/21/2016   ID:  Paul Wolf, DOB [birth year], MRN [id number]  PCP:  Darrow BussingKOIRALA,DIBAS, MD  Cardiologist: Tobias AlexanderKatarina Aryan Sparks, MD   Chief complain: Palpitations   History of Present Illness: Paul DecreeJeffrey Danese is a 35 y.o. male who presents for evaluation of palpitations. The patient presented to the ER in February of these year with palpitations. At the time patient had upper respiratory infection, had multiple coffees and used oral decongestant. In the ER his EKG showed sinus tachycardia with ventricular rate of 130. 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 He is a 35-year-old is very busy and doesn't exercise. His major concern is that on several occasion his heart rate runs about 110 and is associated palpitations. He denies any associated chest pain or shortness of breath with days no presyncope or syncope in the past. He denies use of any drugs or energy drinks. In his family his father has history of atrial fibrillation otherwise his grandfather and his brother had myocardial infarction in 3570s.  02/21/2016 - 1 year follow-up, in the last year the patient hasn't experienced any palpitations or syncope. Denies any chest pain or dyspnea on exertion. He has a 35-year-old daughter and is active but doesn't exercise on a regular basis. He feels that he gained more weight. Since then he has no snoring. He's been compliant with pravastatin and has no side effects.  Past Medical History:  Diagnosis Date  . Palpitations    No past surgical history on file.  Current Outpatient Prescriptions  Medication Sig Dispense Refill  . FIBER SELECT GUMMIES PO Take by mouth.    . pravastatin (PRAVACHOL) 10 MG tablet Take 1 tablet (10 mg total) by mouth daily. 90  tablet 0   No current facility-administered medications for this visit.    Allergies:   Review of patient's allergies indicates no known allergies.   Social History:  The patient  reports that he has been smoking Cigars.  He has never used smokeless tobacco. He reports that he drinks alcohol. He reports that he does not use drugs.   Family History:  The patient's family history includes Skin cancer in his father.    ROS:  Please see the history of present illness.   Otherwise, review of systems are positive for none.   All other systems are reviewed and negative.    PHYSICAL EXAM: VS:  BP 132/76   Pulse 76   Ht 5\' 8"  (1.727 m)   Wt 241 lb (109.3 kg)   BMI 36.64 kg/m  , BMI Body mass index is 36.64 kg/m. GEN: Well nourished, well developed, in no acute distress  HEENT: normal  Neck: no JVD, carotid bruits, or masses Cardiac: RRR; no murmurs, rubs, or gallops,no edema  Respiratory:  clear to auscultation bilaterally, normal work of breathing GI: soft, nontender, nondistended, + BS MS: no deformity or atrophy  Skin: warm and dry, no rash Neuro:  Strength and sensation are intact Psych: euthymic mood, full affect  EKG:  EKG, SR, norma ECG  Recent Labs: No results found for requested labs within last 8760 hours.   Lipid Panel    Component Value Date/Time   CHOL 173 11/06/2014 0939   CHOL 209 (H) 08/30/2014  0935   TRIG 174.0 (H) 11/06/2014 0939   TRIG 181 (H) 08/30/2014 0935   HDL 38.80 (L) 11/06/2014 0939   HDL 34 (L) 08/30/2014 0935   CHOLHDL 4 11/06/2014 0939   VLDL 34.8 11/06/2014 0939   LDLCALC 99 11/06/2014 0939   LDLCALC 139 (H) 08/30/2014 0935   Wt Readings from Last 3 Encounters:  02/21/16 241 lb (109.3 kg)  08/30/14 228 lb 12.8 oz (103.8 kg)  07/10/14 240 lb (108.9 kg)    Other studies Reviewed: Additional studies/ records that were reviewed today include: EKG from the ER, records from his primary care physician.   ASSESSMENT AND PLAN:  35 year old  gentleman  1. Palpitations - the episode in the ER can be contributed to acute infectious disease - upper respiratory infection with mild leukocytosis And use of caffeine and ephedrine. No palpitations since then.  2. Hyperlipidemia - on pravastatin, we will check lipids and CMP today.  3. Obesity and snoring, he is advised on regular exercise, is also concerned about potential development of atrial fibrillation that his father has, is advised that the best prevention issues in great.  4. Elevated blood glucose - globin A1c a year ago 5.5% that is within normal range, he is advised on losing weight and regular exercise.   Current medicines are reviewed at length with the patient today.  The patient does not have concerns regarding medicines.  The following changes have been made:  no change  Labs/ tests ordered today include:  Orders Placed This Encounter  Procedures  . NMR Lipoprofile with Lipids  . Lipoprotein A (LPA)  . HgB A1c  . Holter monitor - 48 hour   Disposition:   FU with Tobias Alexander in 2 months.   Signed, Tobias Alexander, MD  02/21/2016 8:19 AM    [facility] [address] [place], [place], [place]  [address] Phone: [phone]; Fax: [phone]

## 2016-03-07 ENCOUNTER — Other Ambulatory Visit: Payer: Self-pay | Admitting: Cardiology

## 2017-02-26 ENCOUNTER — Other Ambulatory Visit: Payer: Self-pay | Admitting: Cardiology

## 2017-03-29 ENCOUNTER — Other Ambulatory Visit: Payer: Self-pay | Admitting: Cardiology

## 2017-04-14 ENCOUNTER — Other Ambulatory Visit: Payer: Self-pay | Admitting: Cardiology

## 2017-04-29 ENCOUNTER — Other Ambulatory Visit: Payer: Self-pay | Admitting: Cardiology

## 2017-07-03 ENCOUNTER — Telehealth: Payer: Self-pay | Admitting: Cardiology

## 2017-07-03 MED ORDER — PRAVASTATIN SODIUM 10 MG PO TABS: 10.0000 mg | ORAL_TABLET | Freq: Every day | ORAL | 0 refills | Status: DC

## 2017-07-03 NOTE — Telephone Encounter (Signed)
°*  STAT* If patient is at the pharmacy, call can be transferred to refill team.   1. Which medications need to be refilled? (please list name of each medication and dose if known) Pravastatin 10 mg  2. Which pharmacy/location (including street and city if local pharmacy) is medication to be sent to?[id number] [place] [place] - [address]  3. Do they need a 30 day or 90 day supply? 90   Patient has appt with Dr. Delton SeeNelson 07-13-17 @8am

## 2017-07-03 NOTE — Telephone Encounter (Signed)
Pt's medication was sent to pt's pharmacy as requested. 30 day supply until pt comes in on 07/13/17 to see Dr. Delton SeeNelson. Confirmation received.

## 2017-07-13 ENCOUNTER — Ambulatory Visit: Payer: BC Managed Care – PPO | Admitting: Cardiology

## 2017-07-13 ENCOUNTER — Encounter: Payer: Self-pay | Admitting: Cardiology

## 2017-07-13 VITALS — BP 114/88 | HR 92 | Ht 68.0 in | Wt 236.0 lb

## 2017-07-13 DIAGNOSIS — R002 Palpitations: Secondary | ICD-10-CM | POA: Diagnosis not present

## 2017-07-13 DIAGNOSIS — E782 Mixed hyperlipidemia: Secondary | ICD-10-CM

## 2017-07-13 LAB — HEPATIC FUNCTION PANEL
ALT: 21 IU/L (ref 0–44)
AST: 18 IU/L (ref 0–40)
Albumin: 4.7 g/dL (ref 3.5–5.5)
Alkaline Phosphatase: 58 IU/L (ref 39–117)
Bilirubin Total: 0.5 mg/dL (ref 0.0–1.2)
Bilirubin, Direct: 0.14 mg/dL (ref 0.00–0.40)
Total Protein: 7.4 g/dL (ref 6.0–8.5)

## 2017-07-13 LAB — CBC
Hematocrit: 44.7 % (ref 37.5–51.0)
Hemoglobin: 15.1 g/dL (ref 13.0–17.7)
MCH: 29.3 pg (ref 26.6–33.0)
MCHC: 33.8 g/dL (ref 31.5–35.7)
MCV: 87 fL (ref 79–97)
Platelets: 273 10*3/uL (ref 150–379)
RBC: 5.15 x10E6/uL (ref 4.14–5.80)
RDW: 13.7 % (ref 12.3–15.4)
WBC: 7.5 10*3/uL (ref 3.4–10.8)

## 2017-07-13 LAB — LIPID PANEL
Chol/HDL Ratio: 4.6 ratio (ref 0.0–5.0)
Cholesterol, Total: 179 mg/dL (ref 100–199)
HDL: 39 mg/dL — ABNORMAL LOW (ref >39)
LDL Calculated: 99 mg/dL (ref 0–99)
Triglycerides: 204 mg/dL — ABNORMAL HIGH (ref 0–149)
VLDL Cholesterol Cal: 41 mg/dL — ABNORMAL HIGH (ref 5–40)

## 2017-07-13 LAB — BASIC METABOLIC PANEL
BUN/Creatinine Ratio: 15 (ref 9–20)
BUN: 14 mg/dL (ref 6–20)
CO2: 23 mmol/L (ref 20–29)
Calcium: 9.8 mg/dL (ref 8.7–10.2)
Chloride: 99 mmol/L (ref 96–106)
Creatinine, Ser: 0.93 mg/dL (ref 0.76–1.27)
GFR calc Af Amer: 122 mL/min/{1.73_m2} (ref >59)
GFR calc non Af Amer: 105 mL/min/{1.73_m2} (ref >59)
Glucose: 92 mg/dL (ref 65–99)
Potassium: 4.4 mmol/L (ref 3.5–5.2)
Sodium: 139 mmol/L (ref 134–144)

## 2017-07-13 LAB — TSH: TSH: 2.31 u[IU]/mL (ref 0.450–4.500)

## 2017-07-13 MED ORDER — PRAVASTATIN SODIUM 10 MG PO TABS: 10.0000 mg | ORAL_TABLET | Freq: Every day | ORAL | 3 refills | Status: DC

## 2017-07-13 NOTE — Patient Instructions (Signed)
Medication Instructions:  Your physician recommends that you continue on your current medications as directed. Please refer to the Current Medication list given to you today.  Labwork: BMET, CBC, TSH, Liver and Lipid today  Testing/Procedures: None  Follow-Up: Your physician wants you to follow-up in: 1 year with Dr. Delton SeeNelson.  You will receive a reminder letter in the mail two months in advance. If you don't receive a letter, please call our office to schedule the follow-up appointment.   Any Other Special Instructions Will Be Listed Below (If Applicable).     If you need a refill on your cardiac medications before your next appointment, please call your pharmacy.

## 2017-07-13 NOTE — Progress Notes (Signed)
Patient ID: Paul Wolf, male   DOB: [birth year], 37 y.o.   MRN: [id number]      Cardiology Office Note   Date:  07/13/2017   ID:  Paul Wolf, DOB [birth year], MRN [id number]  PCP:  Darrow BussingKoirala, Dibas, MD  Cardiologist: Tobias AlexanderKatarina Donney Caraveo, MD   Chief complain: Palpitations   History of Present Illness: Paul Wolf is a 37 y.o. male who presents for evaluation of palpitations. The patient presented to the ER in February of these year with palpitations. At the time patient had upper respiratory infection, had multiple coffees and used oral decongestant. In the ER his EKG showed sinus tachycardia with ventricular rate of 130. 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 He is a 37-year-old is very busy and doesn't exercise. His major concern is that on several occasion his heart rate runs about 110 and is associated palpitations. He denies any associated chest pain or shortness of breath with days no presyncope or syncope in the past. He denies use of any drugs or energy drinks. In his family his father has history of atrial fibrillation otherwise his grandfather and his brother had myocardial infarction in 5770s.  02/21/2016 - 1 year follow-up, in the last year the patient hasn't experienced any palpitations or syncope. Denies any chest pain or dyspnea on exertion. He has a 37-year-old daughter and is active but doesn't exercise on a regular basis. He feels that he gained more weight. Since then he has no snoring. He's been compliant with pravastatin and has no side effects.  07/13/2017, this is one year follow-up, the patient has been doing great, he denies any symptoms of chest pain, shortness of breath, he now has 37-year-old in a 37-year-old and is busy and sleep deprived unable to exercise. However denies any palpitations dizziness or syncope. No lower extremity edema  orthopnea or proximal nocturnal dyspnea. He has been tolerating pravastatin well. His palpitations were determined to be combination of Sudafed and high-dose caffeine, he hasn't had any since the episode in 2017.  Past Medical History:  Diagnosis Date  . Palpitations    No past surgical history on file.  Current Outpatient Medications  Medication Sig Dispense Refill  . FIBER SELECT GUMMIES PO Take by mouth.    . pravastatin (PRAVACHOL) 10 MG tablet Take 1 tablet (10 mg total) by mouth daily. 90 tablet 3   No current facility-administered medications for this visit.    Allergies:   Patient has no known allergies.   Social History:  The patient  reports that he has quit smoking. His smoking use included cigars. he has never used smokeless tobacco. He reports that he drinks alcohol. He reports that he does not use drugs.   Family History:  The patient's family history includes Skin cancer in his father.    ROS:  Please see the history of present illness.   Otherwise, review of systems are positive for none.   All other systems are reviewed and negative.    PHYSICAL EXAM: VS:  BP 114/88 (BP Location: Left Arm, Patient Position: Sitting, Cuff Size: Normal)   Pulse 92   Ht 5\' 8"  (1.727 m)   Wt 236 lb (107 kg)   SpO2 97%   BMI 35.88 kg/m  , BMI Body mass index is 35.88 kg/m. GEN: Well nourished, well developed, in no acute distress  HEENT: normal  Neck:  no JVD, carotid bruits, or masses Cardiac: RRR; no murmurs, rubs, or gallops,no edema  Respiratory:  clear to auscultation bilaterally, normal work of breathing GI: soft, nontender, nondistended, + BS MS: no deformity or atrophy  Skin: warm and dry, no rash Neuro:  Strength and sensation are intact Psych: euthymic mood, full affect  EKG:  EKG, SR, norma ECG  Recent Labs: No results found for requested labs within last 8760 hours.   Lipid Panel    Component Value Date/Time   CHOL 197 02/21/2016 0850   CHOL 209 (H)  08/30/2014 0935   TRIG 163 (H) 02/21/2016 0850   TRIG 181 (H) 08/30/2014 0935   HDL 40 02/21/2016 0850   HDL 34 (L) 08/30/2014 0935   CHOLHDL 4.9 02/21/2016 0850   VLDL 33 (H) 02/21/2016 0850   LDLCALC 124 02/21/2016 0850   LDLCALC 139 (H) 08/30/2014 0935   Wt Readings from Last 3 Encounters:  07/13/17 236 lb (107 kg)  02/21/16 241 lb (109.3 kg)  08/30/14 228 lb 12.8 oz (103.8 kg)    Other studies Reviewed: Additional studies/ records that were reviewed today include: EKG from the ER, records from his primary care physician.   ASSESSMENT AND PLAN:  1. Palpitations - the episode in the ER can be contributed to acute infectious disease - upper respiratory infection with high dose of Sudafed and caffeine, no palpitations ever since.   2. Hyperlipidemia - on pravastatin, we will check lipids and CMP today.  3. Obesity and snoring, he is advised on regular exercise, is also concerned about potential development of atrial fibrillation that his father has, is advised that the best prevention issues in great.  Current medicines are reviewed at length with the patient today.  The patient does not have concerns regarding medicines.  The following changes have been made:  no change  Labs/ tests ordered today include:  Orders Placed This Encounter  Procedures  . NMR Lipoprofile with Lipids  . Lipoprotein A (LPA)  . HgB A1c  . Holter monitor - 48 hour   Disposition:   FU with Tobias Alexander in 2 months.   Signed, Tobias Alexander, MD  07/13/2017 1:47 PM    [facility] [address] [place], [place], [place]  [address] Phone: [phone]; Fax: [phone]

## 2017-12-30 ENCOUNTER — Ambulatory Visit
Admission: RE | Admit: 2017-12-30 | Discharge: 2017-12-30 | Disposition: A | Discharge status: Home / Self Care | Payer: BC Managed Care – PPO | Source: Ambulatory Visit | Attending: Family Medicine | Admitting: Family Medicine

## 2017-12-30 ENCOUNTER — Other Ambulatory Visit: Payer: Self-pay | Admitting: Family Medicine

## 2017-12-30 DIAGNOSIS — M7989 Other specified soft tissue disorders: Secondary | ICD-10-CM

## 2018-07-05 ENCOUNTER — Encounter: Payer: Self-pay | Admitting: Cardiology

## 2018-07-18 ENCOUNTER — Other Ambulatory Visit: Payer: Self-pay | Admitting: Cardiology

## 2018-08-02 ENCOUNTER — Ambulatory Visit: Payer: BC Managed Care – PPO | Admitting: Cardiology

## 2018-08-02 ENCOUNTER — Other Ambulatory Visit: Payer: Self-pay

## 2018-08-02 ENCOUNTER — Encounter: Payer: Self-pay | Admitting: Cardiology

## 2018-08-02 VITALS — BP 124/72 | HR 93 | Ht 68.0 in | Wt 232.8 lb

## 2018-08-02 DIAGNOSIS — R002 Palpitations: Secondary | ICD-10-CM

## 2018-08-02 DIAGNOSIS — E782 Mixed hyperlipidemia: Secondary | ICD-10-CM

## 2018-08-02 LAB — CBC WITH DIFFERENTIAL/PLATELET
Basophils Absolute: 0.1 10*3/uL (ref 0.0–0.2)
Basos: 1 %
EOS (ABSOLUTE): 0.2 10*3/uL (ref 0.0–0.4)
Eos: 3 %
Hematocrit: 44 % (ref 37.5–51.0)
Hemoglobin: 15 g/dL (ref 13.0–17.7)
Immature Grans (Abs): 0 10*3/uL (ref 0.0–0.1)
Immature Granulocytes: 0 %
Lymphocytes Absolute: 2 10*3/uL (ref 0.7–3.1)
Lymphs: 27 %
MCH: 29.2 pg (ref 26.6–33.0)
MCHC: 34.1 g/dL (ref 31.5–35.7)
MCV: 86 fL (ref 79–97)
Monocytes Absolute: 0.7 10*3/uL (ref 0.1–0.9)
Monocytes: 9 %
Neutrophils Absolute: 4.7 10*3/uL (ref 1.4–7.0)
Neutrophils: 60 %
Platelets: 267 10*3/uL (ref 150–450)
RBC: 5.13 x10E6/uL (ref 4.14–5.80)
RDW: 12.8 % (ref 11.6–15.4)
WBC: 7.7 10*3/uL (ref 3.4–10.8)

## 2018-08-02 LAB — COMPREHENSIVE METABOLIC PANEL
ALT: 22 IU/L (ref 0–44)
AST: 20 IU/L (ref 0–40)
Albumin/Globulin Ratio: 2 (ref 1.2–2.2)
Albumin: 4.9 g/dL (ref 4.0–5.0)
Alkaline Phosphatase: 57 IU/L (ref 39–117)
BUN/Creatinine Ratio: 15 (ref 9–20)
BUN: 14 mg/dL (ref 6–20)
Bilirubin Total: 0.5 mg/dL (ref 0.0–1.2)
CO2: 23 mmol/L (ref 20–29)
Calcium: 9.8 mg/dL (ref 8.7–10.2)
Chloride: 101 mmol/L (ref 96–106)
Creatinine, Ser: 0.95 mg/dL (ref 0.76–1.27)
GFR calc Af Amer: 118 mL/min/{1.73_m2} (ref >59)
GFR calc non Af Amer: 102 mL/min/{1.73_m2} (ref >59)
Globulin, Total: 2.5 g/dL (ref 1.5–4.5)
Glucose: 95 mg/dL (ref 65–99)
Potassium: 4.4 mmol/L (ref 3.5–5.2)
Sodium: 140 mmol/L (ref 134–144)
Total Protein: 7.4 g/dL (ref 6.0–8.5)

## 2018-08-02 LAB — LIPID PANEL
Chol/HDL Ratio: 5.3 ratio — ABNORMAL HIGH (ref 0.0–5.0)
Cholesterol, Total: 200 mg/dL — ABNORMAL HIGH (ref 100–199)
HDL: 38 mg/dL — ABNORMAL LOW (ref >39)
LDL Calculated: 114 mg/dL — ABNORMAL HIGH (ref 0–99)
Triglycerides: 241 mg/dL — ABNORMAL HIGH (ref 0–149)
VLDL Cholesterol Cal: 48 mg/dL — ABNORMAL HIGH (ref 5–40)

## 2018-08-02 LAB — TSH: TSH: 1.78 u[IU]/mL (ref 0.450–4.500)

## 2018-08-02 NOTE — Progress Notes (Signed)
Patient ID: Paul Wolf, male   DOB: [birth year], 38 y.o.   MRN: [id number]      Cardiology Office Note   Date:  08/02/2018   ID:  Paul Wolf, DOB 22-Oct-1980, MRN [id number]  PCP:  Paul Bussing, MD  Cardiologist: Tobias Alexander, MD   Chief complain: Palpitations, hyperlipidemia, 1 year follow-up   History of Present Illness: Paul Wolf is a 38 y.o. male who presents for evaluation of palpitations. The patient presented to the ER in February of these year with palpitations. At the time patient had upper respiratory infection, had multiple coffees and used oral decongestant. In the ER his EKG showed sinus tachycardia with ventricular rate of 130. 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 He is a 43-year-old is very busy and doesn't exercise. His major concern is that on several occasion his heart rate runs about 110 and is associated palpitations. He denies any associated chest pain or shortness of breath with days no presyncope or syncope in the past. He denies use of any drugs or energy drinks. In his family his father has history of atrial fibrillation otherwise his grandfather and his brother had myocardial infarction in 20s.  02/21/2016 - 1 year follow-up, in the last year the patient hasn't experienced any palpitations or syncope. Denies any chest pain or dyspnea on exertion. He has a 64-year-old daughter and is active but doesn't exercise on a regular basis. He feels that he gained more weight. Since then he has no snoring. He's been compliant with pravastatin and has no side effects.  07/13/2017, this is one year follow-up, the patient has been doing great, he denies any symptoms of chest pain, shortness of breath, he now has 60-year-old in a 108-year-old and is busy and sleep deprived unable to exercise. However denies any palpitations dizziness or  syncope. No lower extremity edema orthopnea or proximal nocturnal dyspnea. He has been tolerating pravastatin well. His palpitations were determined to be combination of Sudafed and high-dose caffeine, he hasn't had any since the episode in 2017.  08/02/2018 -this is 1 year follow-up, the patient feels great, no recurrent palpitations, he is tolerating pravastatin well.  He has 2 adult children and struggles to find time to exercise more than twice a week.  He teaches at [employer].  Past Medical History:  Diagnosis Date  . Palpitations    No past surgical history on file.  Current Outpatient Medications  Medication Sig Dispense Refill  . FIBER SELECT GUMMIES PO Take by mouth.    . loratadine (CLARITIN) 10 MG tablet Take 10 mg by mouth daily as needed for allergies.    . pravastatin (PRAVACHOL) 10 MG tablet TAKE 1 TABLET BY MOUTH EVERY DAY 90 tablet 0   No current facility-administered medications for this visit.    Allergies:   Patient has no known allergies.   Social History:  The patient  reports that he has quit smoking. His smoking use included cigars. He has never used smokeless tobacco. He reports current alcohol use. He reports that he does not use drugs.   Family History:  The patient's family history includes Skin cancer in his father.    ROS:  Please see the history of present illness.   Otherwise, review of systems are positive for none.   All other systems are reviewed and negative.    PHYSICAL EXAM: VS:  BP 124/72   Pulse 93   Ht 5\' 8"  (1.727 m)   Wt 232 lb 12.8 oz (105.6 kg)   SpO2 99%   BMI 35.40 kg/m  , BMI Body mass index is 35.4 kg/m. GEN: Well nourished, well developed, in no acute distress  HEENT: normal  Neck: no JVD, carotid bruits, or masses Cardiac: RRR; no murmurs, rubs, or gallops,no edema  Respiratory:  clear to auscultation bilaterally, normal work of breathing GI: soft, nontender, nondistended, + BS MS: no deformity or atrophy  Skin: warm and  dry, no rash Neuro:  Strength and sensation are intact Psych: euthymic mood, full affect  EKG:  EKG, SR, norma ECG  Recent Labs: No results found for requested labs within last 8760 hours.   Lipid Panel    Component Value Date/Time   CHOL 179 07/13/2017 0848   CHOL 209 (H) 08/30/2014 0935   TRIG 204 (H) 07/13/2017 0848   TRIG 181 (H) 08/30/2014 0935   HDL 39 (L) 07/13/2017 0848   HDL 34 (L) 08/30/2014 0935   CHOLHDL 4.6 07/13/2017 0848   CHOLHDL 4.9 02/21/2016 0850   VLDL 33 (H) 02/21/2016 0850   LDLCALC 99 07/13/2017 0848   LDLCALC 139 (H) 08/30/2014 0935   Wt Readings from Last 3 Encounters:  08/02/18 232 lb 12.8 oz (105.6 kg)  07/13/17 236 lb (107 kg)  02/21/16 241 lb (109.3 kg)    Other studies Reviewed: Additional studies/ records that were reviewed today include: EKG from the ER, records from his primary care physician.   ASSESSMENT AND PLAN:  1. Palpitations - the episode in the ER can be contributed to acute infectious disease - upper respiratory infection with high dose of Sudafed and caffeine, no palpitations ever since.  He is completely asymptomatic and does not even have symptoms of dizziness.  2. Hyperlipidemia - on pravastatin, we will check lipids and CMP today.  If numbers similar to last year I will increase pravastatin to 20 mg a day.  3. Obesity and snoring, he is again strongly encouraged to exercise at least 15 to 20 minutes a day.  Current medicines are reviewed at length with the patient today.  The patient does not have concerns regarding medicines.  The following changes have been made:  no change  Labs/ tests ordered today include:  Orders Placed This Encounter  Procedures  . NMR Lipoprofile with Lipids  . Lipoprotein A (LPA)  . HgB A1c  . Holter monitor - 48 hour   Disposition:   FU with Tobias Alexander in 2 months.   Signed, Tobias Alexander, MD  08/02/2018 8:54 AM    [facility] [address] [place],  [place], [place]  [address] Phone: [phone]; Fax: [phone]

## 2018-08-02 NOTE — Patient Instructions (Signed)
Medication Instructions:  Your provider recommends that you continue on your current medications as directed. Please refer to the Current Medication list given to you today.   If you need a refill on your cardiac medications before your next appointment, please call your pharmacy.   Lab work: TODAY: CMET, CBC, lipids, TSH If you have labs (blood work) drawn today and your tests are completely normal, you will receive your results only by: Marland Kitchen MyChart Message (if you have MyChart) OR . A paper copy in the mail If you have any lab test that is abnormal or we need to change your treatment, we will call you to review the results.  Follow-Up: At [facility], you and your health needs are our priority.  As part of our continuing mission to provide you with exceptional heart care, we have created designated Provider Care Teams.  These Care Teams include your primary Cardiologist (physician) and Advanced Practice Providers (APPs -  Physician Assistants and Nurse Practitioners) who all work together to provide you with the care you need, when you need it. You will need a follow up appointment in 12 months.  Please call our office 2 months in advance to schedule this appointment.  You may see Tobias Alexander, MD or one of the following Advanced Practice Providers on your designated Care Team:   [place], PA-C Ronie Spies, PA-C . Jacolyn Reedy, PA-C

## 2018-08-03 ENCOUNTER — Telehealth: Payer: Self-pay

## 2018-08-03 DIAGNOSIS — E782 Mixed hyperlipidemia: Secondary | ICD-10-CM

## 2018-08-03 MED ORDER — PRAVASTATIN SODIUM 40 MG PO TABS: 40.0000 mg | ORAL_TABLET | Freq: Every evening | ORAL | 3 refills | Status: DC

## 2018-08-03 NOTE — Telephone Encounter (Signed)
-----   Message from Lars Masson, MD sent at 08/03/2018  9:18 AM EDT ----- Normal CBC, CMP, TSH, elevated LDL, TG, I would increase pravastatin to 40 mg po daily.

## 2018-10-20 ENCOUNTER — Other Ambulatory Visit: Payer: Self-pay | Admitting: Cardiology

## 2018-10-27 ENCOUNTER — Other Ambulatory Visit: Payer: Self-pay | Admitting: Cardiology

## 2018-10-27 DIAGNOSIS — E782 Mixed hyperlipidemia: Secondary | ICD-10-CM

## 2018-10-27 MED ORDER — PRAVASTATIN SODIUM 40 MG PO TABS: 40.0000 mg | ORAL_TABLET | Freq: Every evening | ORAL | 3 refills | Status: DC

## 2019-11-30 ENCOUNTER — Other Ambulatory Visit: Payer: Self-pay | Admitting: Cardiology

## 2019-11-30 DIAGNOSIS — E782 Mixed hyperlipidemia: Secondary | ICD-10-CM

## 2019-12-23 ENCOUNTER — Other Ambulatory Visit: Payer: Self-pay | Admitting: Cardiology

## 2019-12-23 DIAGNOSIS — E782 Mixed hyperlipidemia: Secondary | ICD-10-CM

## 2020-01-12 NOTE — Progress Notes (Signed)
Cardiology Office Note:    Date:  01/13/2020   ID:  Raphael Gibney, DOB [birth year], MRN [id number]  PCP:  Lujean Amel, MD  Cardiologist:  Ena Dawley, MD  Electrophysiologist:  None   Referring MD: Lujean Amel, MD   Chief Complaint:  Follow-up (Hyperlipidemia)    Patient Profile:    Paul Wolf is a 39 y.o. male with:   Palpitations   Obesity   Hyperlipidemia   Prior CV studies: 24 Hr Holter 4/16 Normal sinus rhythm   History of Present Illness:    Mr. Aull was last seen by Dr. Meda Coffee in 07/2018.  He returns for follow up.  He is here alone.  He has been doing well without chest discomfort, shortness of breath, syncope, orthopnea, leg swelling.  Past Medical History:  Diagnosis Date  . Mixed hyperlipidemia 02/21/2016  . Palpitations     Current Medications: Current Meds  Medication Sig  . FIBER SELECT GUMMIES PO Take 1 gummy by mouth occasionally  . loratadine (CLARITIN) 10 MG tablet Take 10 mg by mouth daily as needed for allergies.  . pravastatin (PRAVACHOL) 40 MG tablet Take 1 tablet (40 mg total) by mouth daily at 6 PM.  . [DISCONTINUED] pravastatin (PRAVACHOL) 40 MG tablet Take 1 tablet (40 mg total) by mouth daily at 6 PM. Please make overdue appt with Dr. Meda Coffee before anymore refills. 1st attempt     Allergies:   Patient has no known allergies.   Social History   Tobacco Use  . Smoking status: Former Smoker    Types: Cigars  . Smokeless tobacco: Never Used  . Tobacco comment: cigars  Vaping Use  . Vaping Use: Never used  Substance Use Topics  . Alcohol use: Yes    Alcohol/week: 0.0 standard drinks  . Drug use: No       Family Hx: The patient's family history includes Skin cancer in his father.  ROS  See HPI   EKGs/Labs/Other Test Reviewed:    EKG:  EKG is   ordered today.  The ekg ordered today demonstrates normal sinus rhythm, heart rate 76, normal axis, no ST-T wave changes, QTC 445  Recent Labs: No results found for  requested labs within last 8760 hours.   Recent Lipid Panel Lab Results  Component Value Date/Time   CHOL 200 (H) 08/02/2018 08:54 AM   CHOL 209 (H) 08/30/2014 09:35 AM   TRIG 241 (H) 08/02/2018 08:54 AM   TRIG 181 (H) 08/30/2014 09:35 AM   HDL 38 (L) 08/02/2018 08:54 AM   HDL 34 (L) 08/30/2014 09:35 AM   CHOLHDL 5.3 (H) 08/02/2018 08:54 AM   CHOLHDL 4.9 02/21/2016 08:50 AM   LDLCALC 114 (H) 08/02/2018 08:54 AM   LDLCALC 139 (H) 08/30/2014 09:35 AM    Physical Exam:    VS:  BP 138/78   Pulse 76   Ht $R'5\' 8"'Qz$  (1.727 m)   Wt 231 lb (104.8 kg)   SpO2 98%   BMI 35.12 kg/m     Wt Readings from Last 3 Encounters:  01/13/20 231 lb (104.8 kg)  08/02/18 232 lb 12.8 oz (105.6 kg)  07/13/17 236 lb (107 kg)     Constitutional:      Appearance: Healthy appearance. Not in distress.  Neck:     Thyroid: No thyromegaly.     Vascular: No carotid bruit. JVD normal.  Pulmonary:     Effort: Pulmonary effort is normal.     Breath sounds: No wheezing. No rales.  Cardiovascular:  Normal rate. Regular rhythm. Normal S1. Normal S2.     Murmurs: There is no murmur.  Edema:    Peripheral edema absent.  Abdominal:     Palpations: Abdomen is soft.  Skin:    General: Skin is warm and dry.  Neurological:     General: No focal deficit present.     Mental Status: Alert and oriented to person, place and time.     Cranial Nerves: Cranial nerves are intact.      ASSESSMENT & PLAN:    1. Mixed hyperlipidemia He is tolerating statin therapy with pravastatin.  Continue current medications and arrange fasting lipids, c-Met.  Follow-up 1 year.  2. Elevated BP without diagnosis of hypertension Blood pressure is somewhat borderline.  I have asked him to monitor his pressures over the next couple weeks and send those readings to me for review.  We discussed the importance of dietary changes, weight loss and regular activity to reduce his blood pressure.    Dispo:  Return in about 1 year (around  01/12/2021) for Routine Follow Up with Dr. Meda Coffee, in person.   Medication Adjustments/Labs and Tests Ordered: Current medicines are reviewed at length with the patient today.  Concerns regarding medicines are outlined above.  Tests Ordered: Orders Placed This Encounter  Procedures  . Comprehensive metabolic panel  . Lipid panel  . EKG 12-Lead   Medication Changes: Meds ordered this encounter  Medications  . pravastatin (PRAVACHOL) 40 MG tablet    Sig: Take 1 tablet (40 mg total) by mouth daily at 6 PM.    Dispense:  90 tablet    Refill:  3    Signed, Richardson Dopp, PA-C  01/13/2020 11:47 AM    [facility] [place], [place], [place]  [address] Phone: [phone]; Fax: [phone]

## 2020-01-13 ENCOUNTER — Other Ambulatory Visit: Payer: Self-pay

## 2020-01-13 ENCOUNTER — Ambulatory Visit: Payer: BC Managed Care – PPO | Admitting: Physician Assistant

## 2020-01-13 ENCOUNTER — Encounter: Payer: Self-pay | Admitting: Physician Assistant

## 2020-01-13 VITALS — BP 138/78 | HR 76 | Ht 68.0 in | Wt 231.0 lb

## 2020-01-13 DIAGNOSIS — R030 Elevated blood-pressure reading, without diagnosis of hypertension: Secondary | ICD-10-CM

## 2020-01-13 DIAGNOSIS — E782 Mixed hyperlipidemia: Secondary | ICD-10-CM | POA: Diagnosis not present

## 2020-01-13 LAB — LIPID PANEL
Chol/HDL Ratio: 3.7 ratio (ref 0.0–5.0)
Cholesterol, Total: 164 mg/dL (ref 100–199)
HDL: 44 mg/dL (ref >39)
LDL Chol Calc (NIH): 102 mg/dL — ABNORMAL HIGH (ref 0–99)
Triglycerides: 99 mg/dL (ref 0–149)
VLDL Cholesterol Cal: 18 mg/dL (ref 5–40)

## 2020-01-13 LAB — COMPREHENSIVE METABOLIC PANEL
ALT: 18 IU/L (ref 0–44)
AST: 21 IU/L (ref 0–40)
Albumin/Globulin Ratio: 1.7 (ref 1.2–2.2)
Albumin: 4.7 g/dL (ref 4.0–5.0)
Alkaline Phosphatase: 61 IU/L (ref 48–121)
BUN/Creatinine Ratio: 13 (ref 9–20)
BUN: 11 mg/dL (ref 6–20)
Bilirubin Total: 0.6 mg/dL (ref 0.0–1.2)
CO2: 22 mmol/L (ref 20–29)
Calcium: 9.5 mg/dL (ref 8.7–10.2)
Chloride: 103 mmol/L (ref 96–106)
Creatinine, Ser: 0.88 mg/dL (ref 0.76–1.27)
GFR calc Af Amer: 125 mL/min/{1.73_m2} (ref >59)
GFR calc non Af Amer: 108 mL/min/{1.73_m2} (ref >59)
Globulin, Total: 2.8 g/dL (ref 1.5–4.5)
Glucose: 95 mg/dL (ref 65–99)
Potassium: 4.3 mmol/L (ref 3.5–5.2)
Sodium: 139 mmol/L (ref 134–144)
Total Protein: 7.5 g/dL (ref 6.0–8.5)

## 2020-01-13 MED ORDER — PRAVASTATIN SODIUM 40 MG PO TABS: 40.0000 mg | ORAL_TABLET | Freq: Every day | ORAL | 3 refills | Status: DC

## 2020-01-13 NOTE — Patient Instructions (Addendum)
Medication Instructions:  Your physician recommends that you continue on your current medications as directed. Please refer to the Current Medication list given to you today.  *If you need a refill on your cardiac medications before your next appointment, please call your pharmacy*  Lab Work: You will have labs drawn today: CMET/Lipids  Testing/Procedures: None ordered today  Follow-Up: At [facility], you and your health needs are our priority.  As part of our continuing mission to provide you with exceptional heart care, we have created designated Provider Care Teams.  These Care Teams include your primary Cardiologist (physician) and Advanced Practice Providers (APPs -  Physician Assistants and Nurse Practitioners) who all work together to provide you with the care you need, when you need it.  Your next appointment:   12 month(s)  The format for your next appointment:   In Person  Provider:   Tobias Alexander, MD  Other Instructions Check blood pressure once a day for 2 weeks and send readings through my chart.

## 2020-04-09 IMAGING — DX DG FINGER MIDDLE 2+V*L*
3 series · 3 of 3 positions shown · non-contrast
Comparison: None.

CLINICAL DATA: Pain and swelling, limited range of motion

EXAM:
LEFT MIDDLE FINGER 2+V

[dg finger middle left (1 of 3)]
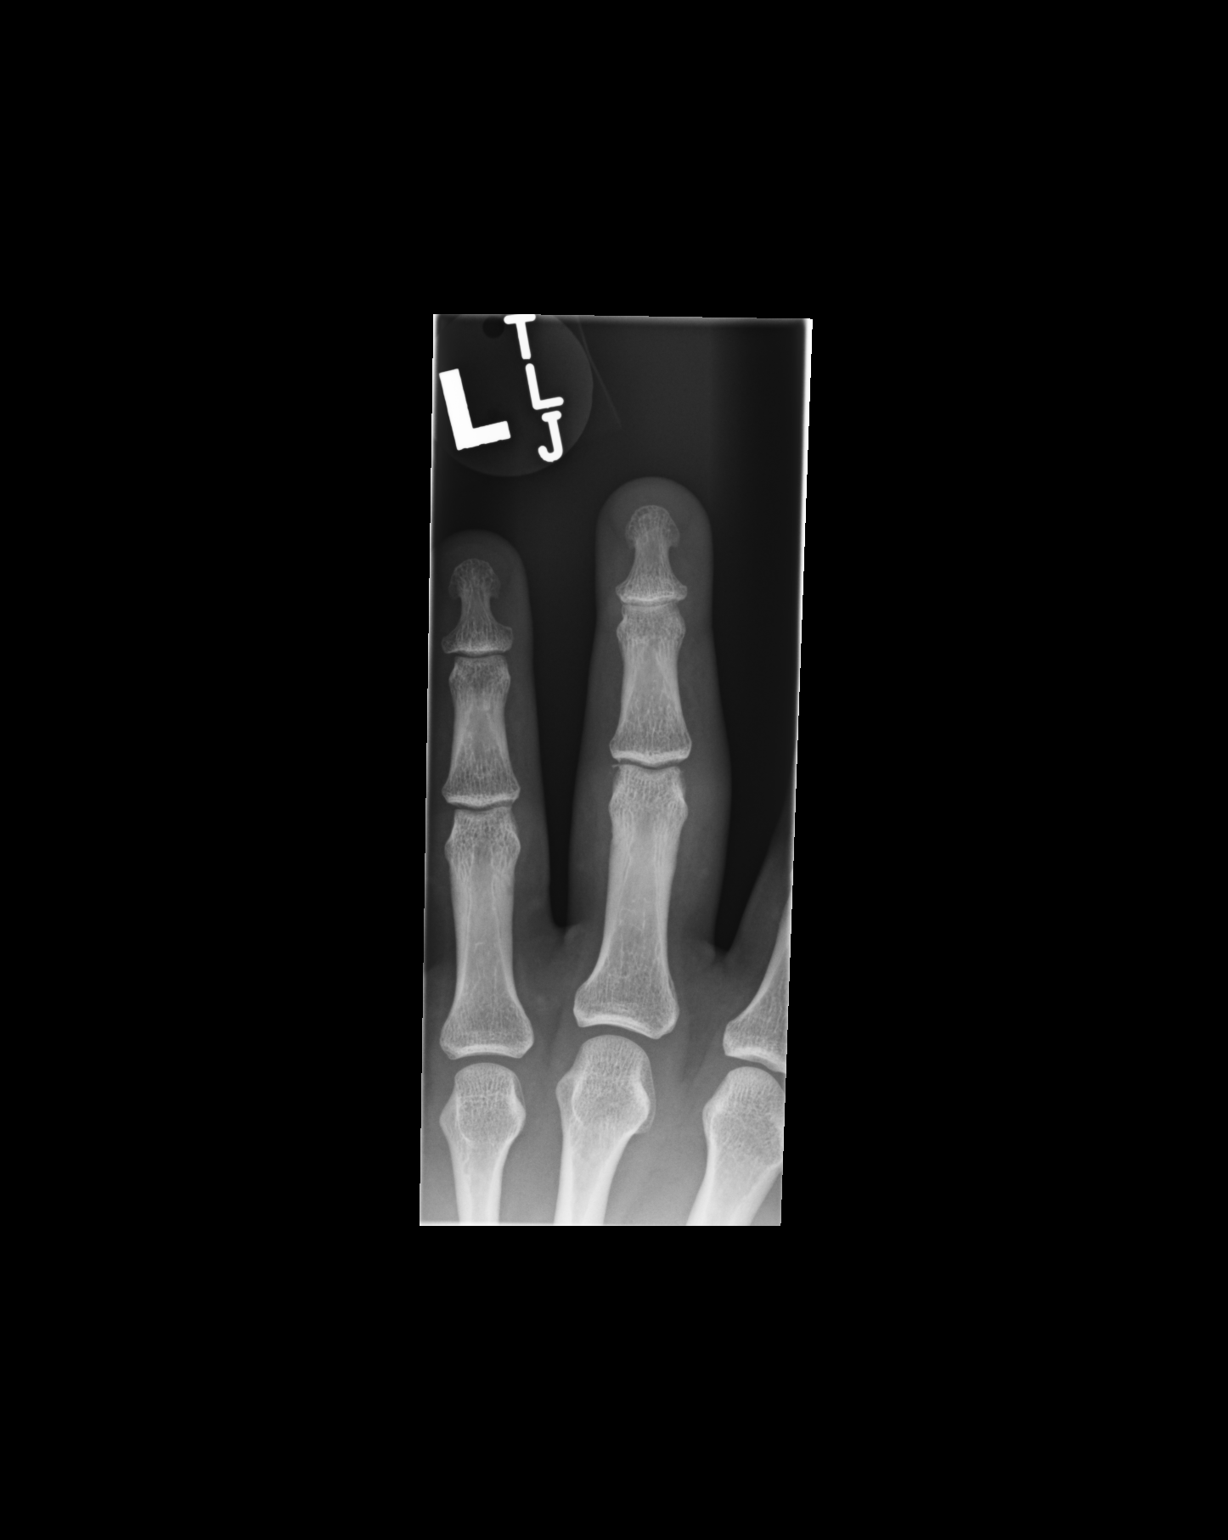

[dg finger middle left (2 of 3)]
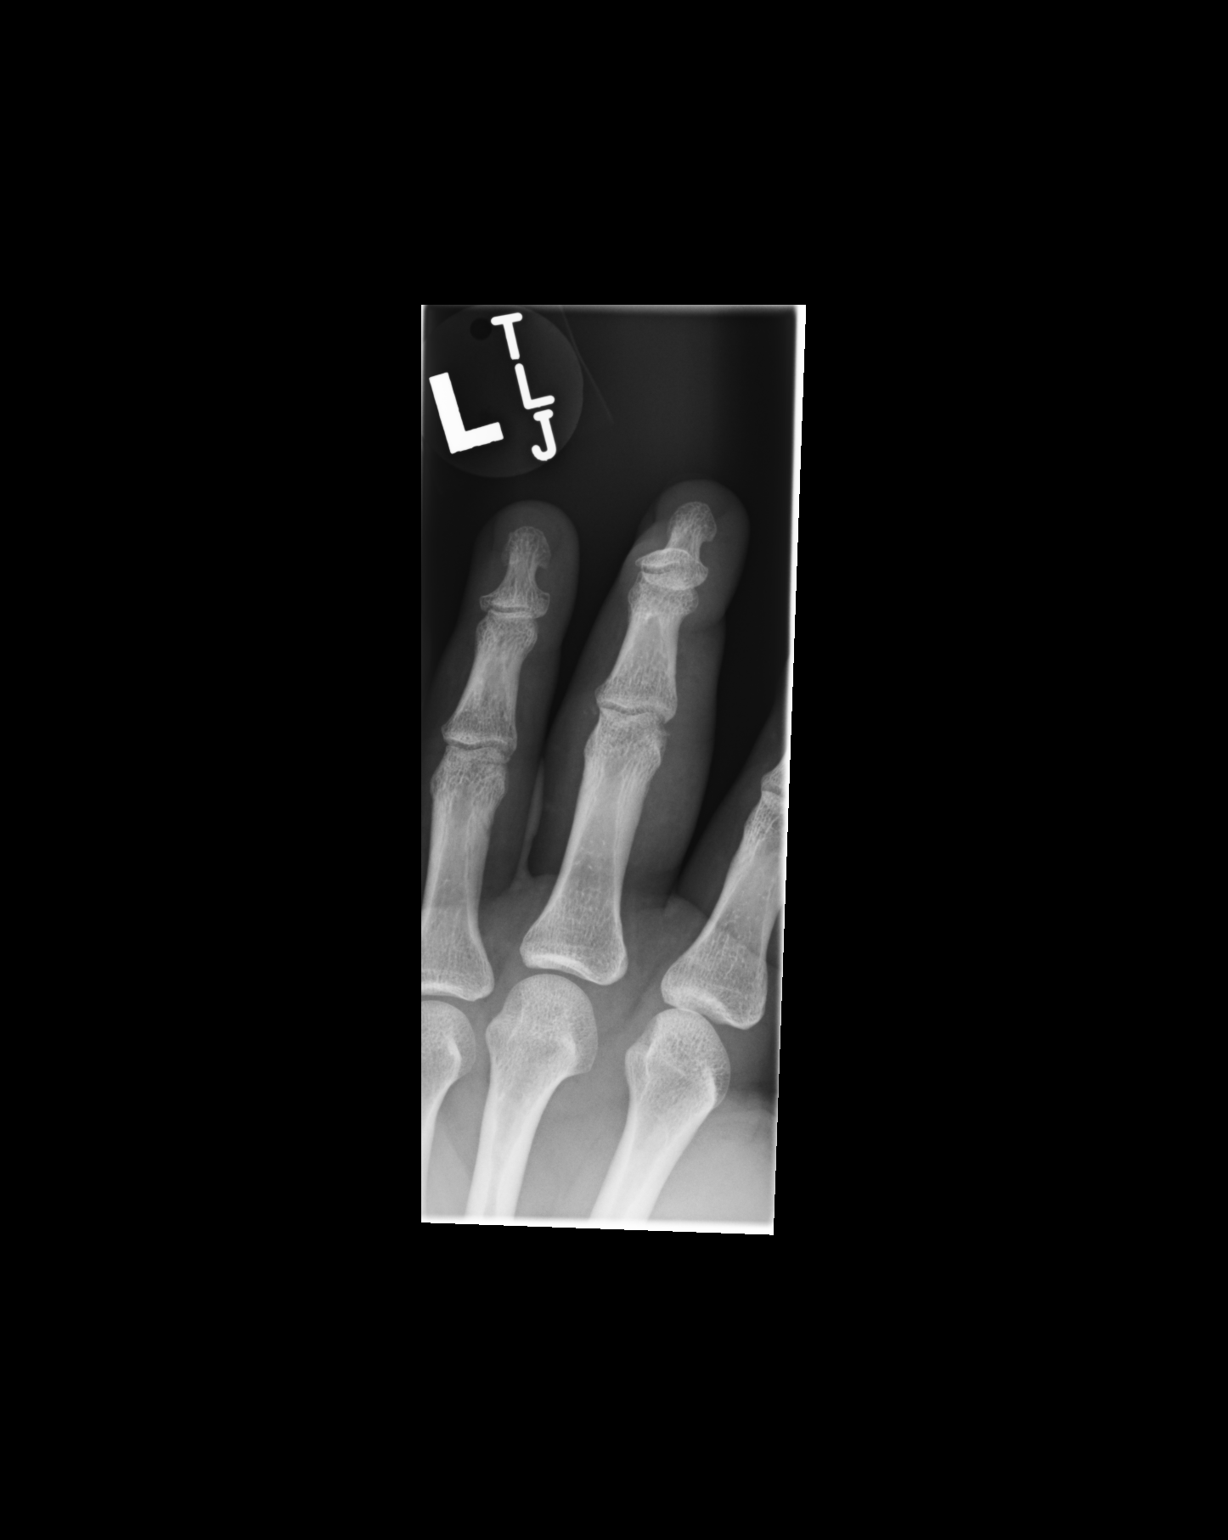

[dg finger middle left (3 of 3)]
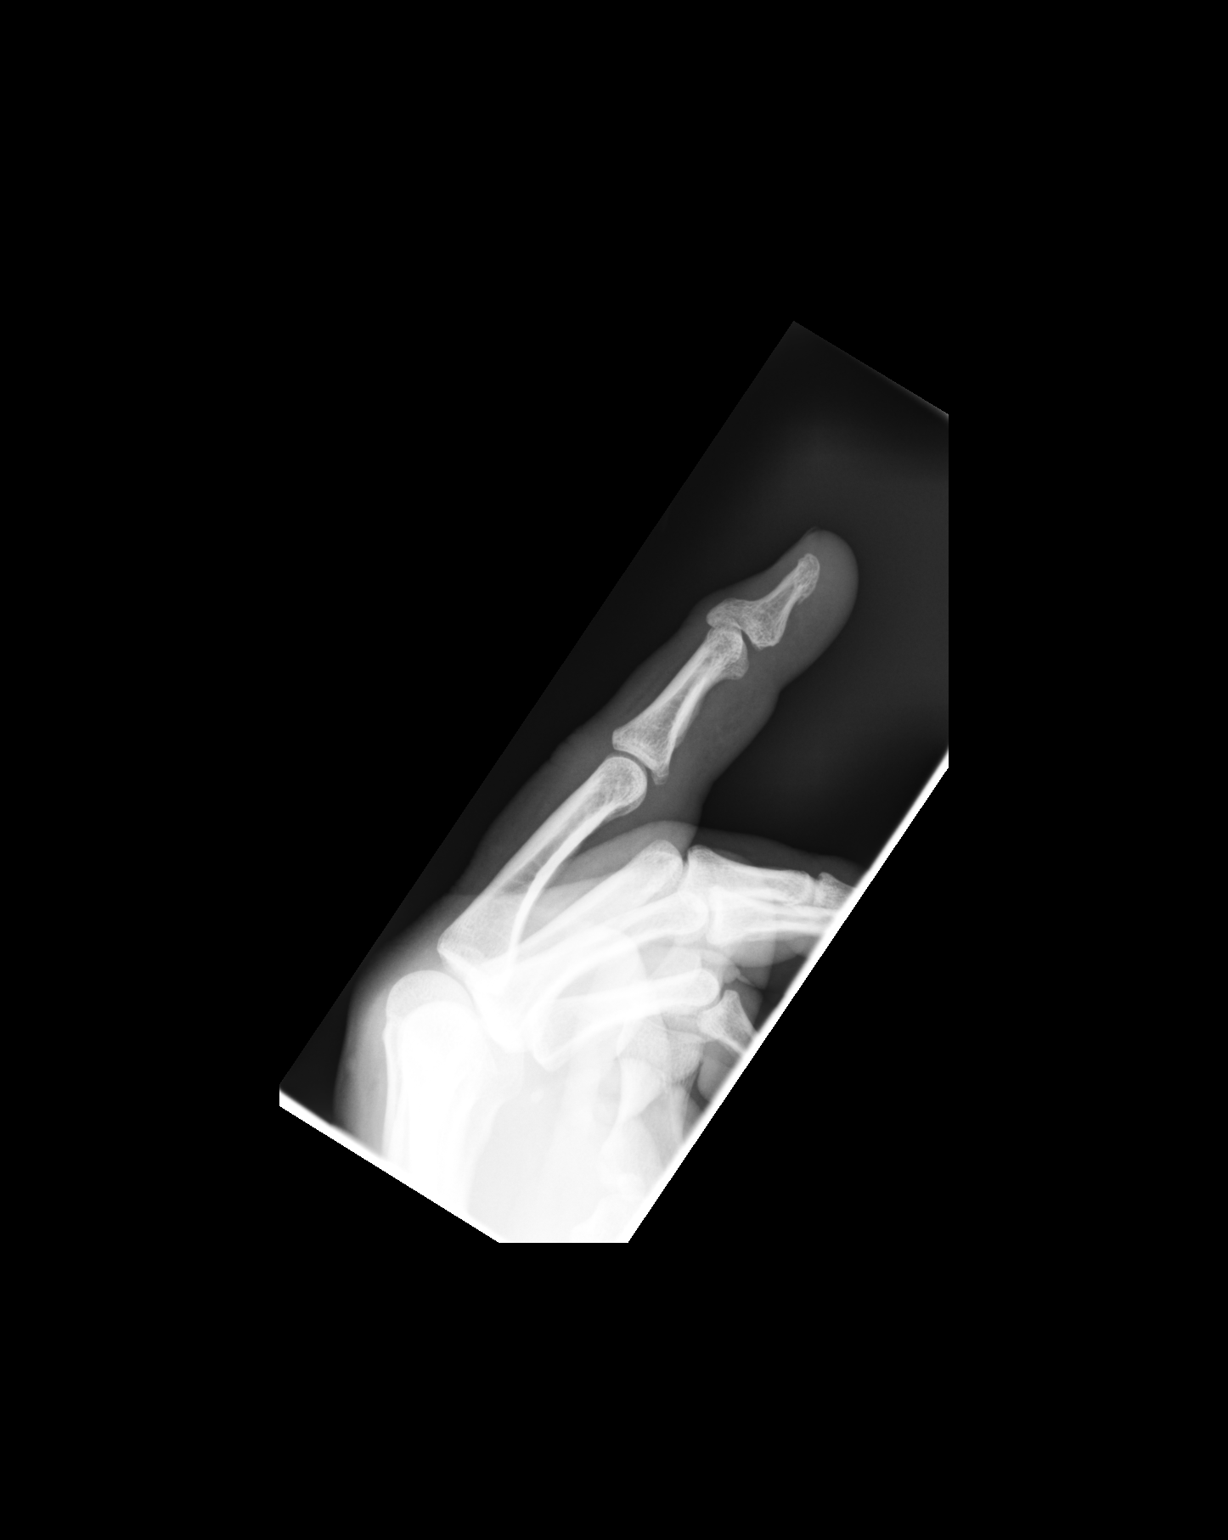

[3 of 3 positions shown; findings below may reference images not displayed]

FINDINGS: No acute fracture or dislocation. No aggressive osseous lesion. Mild
hypertrophic changes of the dorsal base of the third distal phalanx.
Soft tissues are normal.
IMPRESSION: No acute osseous injury of the left third digit.

## 2021-01-16 ENCOUNTER — Other Ambulatory Visit: Payer: Self-pay | Admitting: Physician Assistant

## 2021-01-16 DIAGNOSIS — E782 Mixed hyperlipidemia: Secondary | ICD-10-CM

## 2021-02-11 ENCOUNTER — Other Ambulatory Visit: Payer: Self-pay | Admitting: Physician Assistant

## 2021-02-11 DIAGNOSIS — E782 Mixed hyperlipidemia: Secondary | ICD-10-CM

## 2021-02-21 ENCOUNTER — Other Ambulatory Visit: Payer: Self-pay | Admitting: Physician Assistant

## 2021-02-21 DIAGNOSIS — E782 Mixed hyperlipidemia: Secondary | ICD-10-CM

## 2021-03-19 ENCOUNTER — Telehealth: Payer: Self-pay | Admitting: Cardiology

## 2021-03-19 DIAGNOSIS — E782 Mixed hyperlipidemia: Secondary | ICD-10-CM

## 2021-03-19 MED ORDER — PRAVASTATIN SODIUM 40 MG PO TABS: 40.0000 mg | ORAL_TABLET | Freq: Every day | ORAL | 3 refills | Status: DC

## 2021-03-19 NOTE — Telephone Encounter (Signed)
*  STAT* If patient is at the pharmacy, call can be transferred to refill team.   1. Which medications need to be refilled? (please list name of each medication and dose if known)  pravastatin (PRAVACHOL) 40 MG tablet  2. Which pharmacy/location (including street and city if local pharmacy) is medication to be sent to? [id number] - [place], [place] - [address]  3. Do they need a 30 day or 90 day supply? 90    Patient is scheduled to see Dr. Shari Prows until 07/15/21. Patient is also on a wait list

## 2021-03-19 NOTE — Telephone Encounter (Signed)
Pt has been scheduled to see Dr. Shari Prows 07/11/2021.  Will refill Pravastatin.

## 2021-06-14 ENCOUNTER — Other Ambulatory Visit: Payer: Self-pay | Admitting: *Deleted

## 2021-06-14 DIAGNOSIS — E782 Mixed hyperlipidemia: Secondary | ICD-10-CM

## 2021-06-14 MED ORDER — PRAVASTATIN SODIUM 40 MG PO TABS: 40.0000 mg | ORAL_TABLET | Freq: Every day | ORAL | 3 refills | Status: DC

## 2021-07-08 NOTE — Progress Notes (Deleted)
Cardiology Office Note:    Date:  07/08/2021   ID:  Auther Lyerly, DOB 11-03-1980, MRN [id number]  PCP:  [name], MD   [facility] Providers Cardiologist:  Tobias Alexander, MD {   Referring MD: [name], MD    History of Present Illness:    Paul Wolf is a 41 y.o. male with a hx of HLD, obesity and palpitations who was previously followed by Dr. [name] who now presents to clinic for follow-up.  Past Medical History:  Diagnosis Date   Mixed hyperlipidemia 02/21/2016   Palpitations     No past surgical history on file.  Current Medications: No outpatient medications have been marked as taking for the 07/15/21 encounter (Appointment) with Meriam Sprague, MD.     Allergies:   Patient has no known allergies.   Social History   Socioeconomic History   Marital status: Married    Spouse name: Not on file   Number of children: 2   Years of education: Not on file   Highest education level: Not on file  Occupational History   Occupation: Statistician    Employer: [employer]  Tobacco Use   Smoking status: Former    Types: Cigars   Smokeless tobacco: Never   Tobacco comments:    cigars  Vaping Use   Vaping Use: Never used  Substance and Sexual Activity   Alcohol use: Yes    Alcohol/week: 0.0 standard drinks   Drug use: No   Sexual activity: Not on file  Other Topics Concern   Not on file  Social History Narrative   Not on file   Social Determinants of Health   Financial Resource Strain: Not on file  Food Insecurity: Not on file  Transportation Needs: Not on file  Physical Activity: Not on file  Stress: Not on file  Social Connections: Not on file     Family History: The patient's ***family history includes Skin cancer in his father.  ROS:   Please see the history of present illness.    *** All other systems reviewed and are negative.  EKGs/Labs/Other Studies Reviewed:    The following studies were reviewed today: 24 Hr  Holter 4/16 Normal sinus rhythm   EKG:  EKG is *** ordered today.  The ekg ordered today demonstrates ***  Recent Labs: No results found for requested labs within last 8760 hours.  Recent Lipid Panel    Component Value Date/Time   CHOL 164 01/13/2020 1147   CHOL 209 (H) 08/30/2014 0935   TRIG 99 01/13/2020 1147   TRIG 181 (H) 08/30/2014 0935   HDL 44 01/13/2020 1147   HDL 34 (L) 08/30/2014 0935   CHOLHDL 3.7 01/13/2020 1147   CHOLHDL 4.9 02/21/2016 0850   VLDL 33 (H) 02/21/2016 0850   LDLCALC 102 (H) 01/13/2020 1147   LDLCALC 139 (H) 08/30/2014 0935     Risk Assessment/Calculations:   {Does this patient have ATRIAL FIBRILLATION?:[phone]}       Physical Exam:    VS:  There were no vitals taken for this visit.    Wt Readings from Last 3 Encounters:  01/13/20 231 lb (104.8 kg)  08/02/18 232 lb 12.8 oz (105.6 kg)  07/13/17 236 lb (107 kg)     GEN: *** Well nourished, well developed in no acute distress HEENT: Normal NECK: No JVD; No carotid bruits LYMPHATICS: No lymphadenopathy CARDIAC: ***RRR, no murmurs, rubs, gallops RESPIRATORY:  Clear to auscultation without rales, wheezing or rhonchi  ABDOMEN: Soft, non-tender,  non-distended MUSCULOSKELETAL:  No edema; No deformity  SKIN: Warm and dry NEUROLOGIC:  Alert and oriented x 3 PSYCHIATRIC:  Normal affect   ASSESSMENT:    No diagnosis found. PLAN:    In order of problems listed above:  #HLD: -Continue pravastatin 40mg  daily       {Are you ordering a CV Procedure (e.g. stress test, cath, DCCV, TEE, etc)?   Press F2        :    Medication Adjustments/Labs and Tests Ordered: Current medicines are reviewed at length with the patient today.  Concerns regarding medicines are outlined above.  No orders of the defined types were placed in this encounter.  No orders of the defined types were placed in this encounter.   There are no Patient Instructions on file for this visit.    Signed, [id number]}, MD  07/08/2021 1:24 PM    [facility]

## 2021-07-15 ENCOUNTER — Encounter: Payer: Self-pay | Admitting: Cardiology

## 2021-07-15 ENCOUNTER — Other Ambulatory Visit: Payer: Self-pay

## 2021-07-15 ENCOUNTER — Ambulatory Visit: Payer: BC Managed Care – PPO | Admitting: Cardiology

## 2021-07-15 VITALS — BP 154/90 | HR 77 | Ht 68.0 in | Wt 233.6 lb

## 2021-07-15 DIAGNOSIS — R030 Elevated blood-pressure reading, without diagnosis of hypertension: Secondary | ICD-10-CM | POA: Diagnosis not present

## 2021-07-15 DIAGNOSIS — E782 Mixed hyperlipidemia: Secondary | ICD-10-CM | POA: Diagnosis not present

## 2021-07-15 NOTE — Patient Instructions (Signed)
Medication Instructions:   Your physician recommends that you continue on your current medications as directed. Please refer to the Current Medication list given to you today.  *If you need a refill on your cardiac medications before your next appointment, please call your pharmacy*   Lab Work:  IN 6 MONTHS HERE IN THE OFFICE--CHECK LIPIDS AND HEMOGLOBIN A1C AT THAT TIME--PLEASE COME FASTING TO THIS LAB APPOINTMENT  If you have labs (blood work) drawn today and your tests are completely normal, you will receive your results only by: MyChart Message (if you have MyChart) OR A paper copy in the mail If you have any lab test that is abnormal or we need to change your treatment, we will call you to review the results.   Follow-Up: At [facility], you and your health needs are our priority.  As part of our continuing mission to provide you with exceptional heart care, we have created designated Provider Care Teams.  These Care Teams include your primary Cardiologist (physician) and Advanced Practice Providers (APPs -  Physician Assistants and Nurse Practitioners) who all work together to provide you with the care you need, when you need it.  We recommend signing up for the patient portal called "MyChart".  Sign up information is provided on this After Visit Summary.  MyChart is used to connect with patients for Virtual Visits (Telemedicine).  Patients are able to view lab/test results, encounter notes, upcoming appointments, etc.  Non-urgent messages can be sent to your provider as well.   To learn more about what you can do with MyChart, go to [email or web address].    Your next appointment:   1 year(s)  The format for your next appointment:   In Person  Provider:   DR. Shari Prows

## 2021-07-15 NOTE — Progress Notes (Signed)
Cardiology Office Note:    Date:  07/15/2021   ID:  Paul Wolf, DOB [birth year], MRN [id number]  PCP:  [name], MD   [facility] Providers Cardiologist:  Tobias Alexander, MD {   Referring MD: [name], MD    History of Present Illness:    Paul Wolf is a 41 y.o. male with a hx of HLD and palpitations who was previously followed by Dr. [name] who now presents to clinic for follow-up.  Today, he is doing well. He reports his initial palpitations for which he saw Dr. [name] were do to taking decongestants and drinking a soda later in the day. Since then, he has not noticed recent episodes of palpitations. He has otherwise been followed for HLD and family history of HLD. Specifically, his father, paternal grandfather, and paternal uncle have high cholesterol. He believes his paternal grandfather's uncle had an MI in his late 66s.  The patient stays active from walking, using the elliptical, and using free weights. He had an upper leg muscular injury in January that prevented him from exercising regularly. However, in the past week, he has been trying to get back to his routine. He denies any anginal symptoms.  He is not interested in taking a calcium score at this time. However, he is open to it in the future. He is tolerating pravastatin.  He had elevated blood pressure today but states this is common in the MD office. Running 110-120s at home.  The patient denies chest pain, chest pressure, dyspnea at rest or with exertion, palpitations, PND, orthopnea, or leg swelling. Denies cough, fever, chills. Denies nausea, vomiting. Denies syncope or presyncope. Denies dizziness or lightheadedness. Denies snoring.  Past Medical History:  Diagnosis Date   Mixed hyperlipidemia 02/21/2016   Palpitations     No past surgical history on file.  Current Medications: Current Meds  Medication Sig   FIBER SELECT GUMMIES PO Take 1 gummy by mouth occasionally   loratadine  (CLARITIN) 10 MG tablet Take 10 mg by mouth daily as needed for allergies.   pravastatin (PRAVACHOL) 40 MG tablet Take 1 tablet (40 mg total) by mouth daily. Pt. Needs to schedule an appt. With provider in order to receive further refills. Thank you. 2nd attempt     Allergies:   Patient has no known allergies.   Social History   Socioeconomic History   Marital status: Married    Spouse name: Not on file   Number of children: 2   Years of education: Not on file   Highest education level: Not on file  Occupational History   Occupation: Statistician    Employer: [employer]  Tobacco Use   Smoking status: Former    Types: Cigars   Smokeless tobacco: Never   Tobacco comments:    cigars  Vaping Use   Vaping Use: Never used  Substance and Sexual Activity   Alcohol use: Yes    Alcohol/week: 0.0 standard drinks   Drug use: No   Sexual activity: Not on file  Other Topics Concern   Not on file  Social History Narrative   Not on file   Social Determinants of Health   Financial Resource Strain: Not on file  Food Insecurity: Not on file  Transportation Needs: Not on file  Physical Activity: Not on file  Stress: Not on file  Social Connections: Not on file     Family History: The patient's family history includes Skin cancer in his father.  ROS:   Please  see the history of present illness.    Review of Systems  Constitutional:  Negative for malaise/fatigue and weight loss.  HENT:  Negative for congestion and sore throat.   Eyes:  Negative for pain.  Respiratory:  Negative for cough and shortness of breath.   Cardiovascular:  Negative for chest pain, palpitations, orthopnea, claudication, leg swelling and PND.  Gastrointestinal:  Negative for heartburn and nausea.  Genitourinary:  Negative for frequency and urgency.  Musculoskeletal:  Negative for joint pain and myalgias.  Skin:  Negative for itching and rash.  Neurological:  Negative for dizziness and headaches.   Endo/Heme/Allergies:  Does not bruise/bleed easily.  Psychiatric/Behavioral:  The patient is not nervous/anxious and does not have insomnia.   All other systems reviewed and are negative.  EKGs/Labs/Other Studies Reviewed:    The following studies were reviewed today: 24 Hr Holter 4/16 Normal sinus rhythm   EKG:   07/15/21: Sinus rhythm, rate 77 bpm  Recent Labs: No results found for requested labs within last 8760 hours.  Recent Lipid Panel    Component Value Date/Time   CHOL 164 01/13/2020 1147   CHOL 209 (H) 08/30/2014 0935   TRIG 99 01/13/2020 1147   TRIG 181 (H) 08/30/2014 0935   HDL 44 01/13/2020 1147   HDL 34 (L) 08/30/2014 0935   CHOLHDL 3.7 01/13/2020 1147   CHOLHDL 4.9 02/21/2016 0850   VLDL 33 (H) 02/21/2016 0850   LDLCALC 102 (H) 01/13/2020 1147   LDLCALC 139 (H) 08/30/2014 0935     Risk Assessment/Calculations:           Physical Exam:    VS:  BP (!) 154/90    Pulse 77    Ht 5\' 8"  (1.727 m)    Wt 233 lb 9.6 oz (106 kg)    SpO2 97%    BMI 35.52 kg/m     Wt Readings from Last 3 Encounters:  07/15/21 233 lb 9.6 oz (106 kg)  01/13/20 231 lb (104.8 kg)  08/02/18 232 lb 12.8 oz (105.6 kg)     GEN: Well nourished, well developed in no acute distress HEENT: Normal NECK: No JVD; No carotid bruits CARDIAC: RRR, no murmurs, rubs, gallops RESPIRATORY:  Clear to auscultation without rales, wheezing or rhonchi  ABDOMEN: Soft, non-tender, non-distended MUSCULOSKELETAL:  No edema; No deformity  SKIN: Warm and dry NEUROLOGIC:  Alert and oriented x 3 PSYCHIATRIC:  Normal affect   ASSESSMENT:    1. Mixed hyperlipidemia   2. Elevated triglycerides with high cholesterol   3. Elevated BP without diagnosis of hypertension    PLAN:    In order of problems listed above:  #HLD: LDL 114 on PCP labs in the setting of gaining weight over the holidays and not being able to exercise as regularly due to leg injury. Plans to resume lifestyle modifications including  diet and exercise now that injury is improved. Declined Ca score today. -Continue pravastatin 40mg  daily -Declined Ca score -Continue lifestyle modifications -Repeat lipid panel in 73months as well as A1C  #Elevated blood pressure without diagnosed of HTN: #[place] HTN: Patient with elevated blood pressures in the office but controlled at home mainly in 110-120s. -Continue lifestyle modifications including low Na diet, exercise and weight loss -Continue monitoring of home BP with goal <120/80       Medication Adjustments/Labs and Tests Ordered: Current medicines are reviewed at length with the patient today.  Concerns regarding medicines are outlined above.  Orders Placed This Encounter  Procedures  Lipid Profile   HgB A1c   EKG 12-Lead   No orders of the defined types were placed in this encounter.   Patient Instructions  Medication Instructions:   Your physician recommends that you continue on your current medications as directed. Please refer to the Current Medication list given to you today.  *If you need a refill on your cardiac medications before your next appointment, please call your pharmacy*   Lab Work:  IN 6 MONTHS HERE IN THE OFFICE--CHECK LIPIDS AND HEMOGLOBIN A1C AT THAT TIME--PLEASE COME FASTING TO THIS LAB APPOINTMENT  If you have labs (blood work) drawn today and your tests are completely normal, you will receive your results only by: MyChart Message (if you have MyChart) OR A paper copy in the mail If you have any lab test that is abnormal or we need to change your treatment, we will call you to review the results.   Follow-Up: At [facility], you and your health needs are our priority.  As part of our continuing mission to provide you with exceptional heart care, we have created designated Provider Care Teams.  These Care Teams include your primary Cardiologist (physician) and Advanced Practice Providers (APPs -  Physician Assistants and Nurse  Practitioners) who all work together to provide you with the care you need, when you need it.  We recommend signing up for the patient portal called "MyChart".  Sign up information is provided on this After Visit Summary.  MyChart is used to connect with patients for Virtual Visits (Telemedicine).  Patients are able to view lab/test results, encounter notes, upcoming appointments, etc.  Non-urgent messages can be sent to your provider as well.   To learn more about what you can do with MyChart, go to [email or web address].    Your next appointment:   1 year(s)  The format for your next appointment:   In Person  Provider:   DR. Ginette Pitman as a scribe for Meriam Sprague, MD.,have documented all relevant documentation on the behalf of Meriam Sprague, MD,as directed by  Meriam Sprague, MD while in the presence of Meriam Sprague, MD.  I, Meriam Sprague, MD, have reviewed all documentation for this visit. The documentation on 07/15/21 for the exam, diagnosis, procedures, and orders are all accurate and complete.   Signed, Meriam Sprague, MD  07/15/2021 8:47 AM    [facility]

## 2021-09-11 ENCOUNTER — Other Ambulatory Visit: Payer: Self-pay | Admitting: *Deleted

## 2021-09-11 DIAGNOSIS — E782 Mixed hyperlipidemia: Secondary | ICD-10-CM

## 2021-09-11 MED ORDER — PRAVASTATIN SODIUM 40 MG PO TABS
40.0000 mg | ORAL_TABLET | Freq: Every day | ORAL | 3 refills | Status: DC
Start: 2021-09-11 — End: 2022-09-08

## 2022-01-13 ENCOUNTER — Other Ambulatory Visit: Payer: BC Managed Care – PPO

## 2022-01-15 ENCOUNTER — Ambulatory Visit: Payer: BC Managed Care – PPO | Attending: Cardiology

## 2022-01-15 DIAGNOSIS — E782 Mixed hyperlipidemia: Secondary | ICD-10-CM

## 2022-01-16 LAB — HEMOGLOBIN A1C
Est. average glucose Bld gHb Est-mCnc: 120 mg/dL
Hgb A1c MFr Bld: 5.8 % — ABNORMAL HIGH (ref 4.8–5.6)

## 2022-01-16 LAB — LIPID PANEL
Chol/HDL Ratio: 4.1 ratio (ref 0.0–5.0)
Cholesterol, Total: 163 mg/dL (ref 100–199)
HDL: 40 mg/dL (ref >39)
LDL Chol Calc (NIH): 94 mg/dL (ref 0–99)
Triglycerides: 168 mg/dL — ABNORMAL HIGH (ref 0–149)
VLDL Cholesterol Cal: 29 mg/dL (ref 5–40)

## 2022-01-22 ENCOUNTER — Other Ambulatory Visit: Payer: BC Managed Care – PPO

## 2022-01-23 ENCOUNTER — Other Ambulatory Visit: Payer: Self-pay | Admitting: Family Medicine

## 2022-01-23 DIAGNOSIS — R590 Localized enlarged lymph nodes: Secondary | ICD-10-CM

## 2022-01-24 ENCOUNTER — Ambulatory Visit
Admission: RE | Admit: 2022-01-24 | Discharge: 2022-01-24 | Disposition: A | Discharge status: Home / Self Care | Payer: BC Managed Care – PPO | Source: Ambulatory Visit | Attending: Family Medicine | Admitting: Family Medicine

## 2022-01-24 DIAGNOSIS — R590 Localized enlarged lymph nodes: Secondary | ICD-10-CM

## 2022-09-08 ENCOUNTER — Other Ambulatory Visit: Payer: Self-pay

## 2022-09-08 DIAGNOSIS — E782 Mixed hyperlipidemia: Secondary | ICD-10-CM

## 2022-09-08 MED ORDER — PRAVASTATIN SODIUM 40 MG PO TABS: 40.0000 mg | ORAL_TABLET | Freq: Every day | ORAL | 0 refills | Status: DC

## 2022-10-10 ENCOUNTER — Other Ambulatory Visit: Payer: Self-pay

## 2022-10-10 ENCOUNTER — Telehealth: Payer: Self-pay | Admitting: Cardiology

## 2022-10-10 DIAGNOSIS — E782 Mixed hyperlipidemia: Secondary | ICD-10-CM

## 2022-10-10 MED ORDER — PRAVASTATIN SODIUM 40 MG PO TABS: 40.0000 mg | ORAL_TABLET | Freq: Every day | ORAL | 0 refills | Status: DC

## 2022-10-10 NOTE — Telephone Encounter (Signed)
Pt's medication was sent to pt's pharmacy as requested. Confirmation recieved.  °

## 2022-10-10 NOTE — Telephone Encounter (Signed)
*  STAT* If patient is at the pharmacy, call can be transferred to refill team.   1. Which medications need to be refilled? (please list name of each medication and dose if known)   pravastatin (PRAVACHOL) 40 MG tablet   2. Which pharmacy/location (including street and city if local pharmacy) is medication to be sent to?  [id number] - [place], [place] - [address]   3. Do they need a 30 day or 90 day supply?   90 day  Patient stated he is completely out of this medication.

## 2022-10-14 NOTE — Telephone Encounter (Signed)
Called pt and left message informing pt that his medication was sent to his pharmacy on 10/10/22 asking him to schedule an overdue appointment with Dr. Shari Prows before anymore refills. Pt has not done so. Pt will not be able to get a 90 day supply because pt has not been seen.

## 2022-10-21 ENCOUNTER — Telehealth: Payer: Self-pay | Admitting: Cardiology

## 2022-10-21 DIAGNOSIS — E782 Mixed hyperlipidemia: Secondary | ICD-10-CM

## 2022-10-21 MED ORDER — PRAVASTATIN SODIUM 40 MG PO TABS: 40.0000 mg | ORAL_TABLET | Freq: Every day | ORAL | 1 refills | Status: DC

## 2022-10-21 NOTE — Telephone Encounter (Signed)
Pt's medication was sent to pt's pharmacy, enough to get pt to his appt. Confirmation received.

## 2022-10-21 NOTE — Telephone Encounter (Signed)
*  STAT* If patient is at the pharmacy, call can be transferred to refill team.   1. Which medications need to be refilled? (please list name of each medication and dose if known)   pravastatin (PRAVACHOL) 40 MG tablet   2. Which pharmacy/location (including street and city if local pharmacy) is medication to be sent to?  [id number] - , [place] - [address]   3. Do they need a 30 day or 90 day supply?   90 day  Patient stated he only has 4 tablets left.  Patient has appointment scheduled on 7/31.

## 2022-12-11 ENCOUNTER — Other Ambulatory Visit: Payer: Self-pay

## 2022-12-11 DIAGNOSIS — E782 Mixed hyperlipidemia: Secondary | ICD-10-CM

## 2022-12-11 MED ORDER — PRAVASTATIN SODIUM 40 MG PO TABS: 40.0000 mg | ORAL_TABLET | Freq: Every day | ORAL | 0 refills | Status: DC

## 2022-12-14 NOTE — Progress Notes (Unsigned)
Cardiology Office Note:    Date:  12/17/2022   ID:  Paul Wolf, DOB [birth year], MRN [id number]  PCP:  [name], MD   [facility] Providers Cardiologist:  Tobias Alexander, MD {   Referring MD: [name], MD    History of Present Illness:    Paul Wolf is a 42 y.o. male with a hx of HLD and palpitations who was previously followed by Dr. [name] who now presents to clinic for follow-up.  Was last seen in clinic on 06/2021 where he was doing well from a CV standpoint. No significant palpitations. Declined Ca score.   Today, the patient overall feels well. No chest pain, SOB, orthopnea, or PND. Remains active and walking 2-3x per day (about 2-43miles per day). No exertional symptoms. Blood pressure is well controlled not on medications. Tolerating pravastatin without myalgias.   Past Medical History:  Diagnosis Date   Mixed hyperlipidemia 02/21/2016   Palpitations     No past surgical history on file.  Current Medications: Current Meds  Medication Sig   loratadine (CLARITIN) 10 MG tablet Take 10 mg by mouth daily as needed for allergies.   pravastatin (PRAVACHOL) 40 MG tablet Take 1 tablet (40 mg total) by mouth daily.     Allergies:   Patient has no known allergies.   Social History   Socioeconomic History   Marital status: Married    Spouse name: Not on file   Number of children: 2   Years of education: Not on file   Highest education level: Not on file  Occupational History   Occupation: Statistician    Employer: [employer]  Tobacco Use   Smoking status: Former    Types: Cigars   Smokeless tobacco: Never   Tobacco comments:    cigars  Vaping Use   Vaping status: Never Used  Substance and Sexual Activity   Alcohol use: Yes    Alcohol/week: 0.0 standard drinks of alcohol   Drug use: No   Sexual activity: Not on file  Other Topics Concern   Not on file  Social History Narrative   Not on file   Social Determinants of Health    Financial Resource Strain: Not on file  Food Insecurity: Not on file  Transportation Needs: Not on file  Physical Activity: Not on file  Stress: Not on file  Social Connections: Not on file     Family History: The patient's family history includes Skin cancer in his father.  ROS:   Please see the history of present illness.     All other systems reviewed and are negative.  EKGs/Labs/Other Studies Reviewed:    The following studies were reviewed today: 24 Hr Holter 4/16 Normal sinus rhythm   EKG:   Personally reviewed: NSR with HR 83bpm  Recent Labs: No results found for requested labs within last 365 days.  Recent Lipid Panel    Component Value Date/Time   CHOL 163 01/15/2022 0744   CHOL 209 (H) 08/30/2014 0935   TRIG 168 (H) 01/15/2022 0744   TRIG 181 (H) 08/30/2014 0935   HDL 40 01/15/2022 0744   HDL 34 (L) 08/30/2014 0935   CHOLHDL 4.1 01/15/2022 0744   CHOLHDL 4.9 02/21/2016 0850   VLDL 33 (H) 02/21/2016 0850   LDLCALC 94 01/15/2022 0744   LDLCALC 139 (H) 08/30/2014 0935     Risk Assessment/Calculations:           Physical Exam:    VS:  BP 128/88   Pulse  90   Ht 5\' 8"  (1.727 m)   Wt 231 lb (104.8 kg)   SpO2 98%   BMI 35.12 kg/m     Wt Readings from Last 3 Encounters:  12/17/22 231 lb (104.8 kg)  07/15/21 233 lb 9.6 oz (106 kg)  01/13/20 231 lb (104.8 kg)     GEN: Well nourished, well developed in no acute distress HEENT: Normal NECK: No JVD; No carotid bruits CARDIAC: RRR, no murmurs, rubs, gallops RESPIRATORY:  Clear to auscultation without rales, wheezing or rhonchi  ABDOMEN: Soft, non-tender, non-distended MUSCULOSKELETAL:  No edema; No deformity  SKIN: Warm and dry NEUROLOGIC:  Alert and oriented x 3 PSYCHIATRIC:  Normal affect   ASSESSMENT:    1. Elevated triglycerides with high cholesterol     PLAN:    In order of problems listed above:  #HLD: -Continue pravastatin 40mg  daily -LDL 101, TG 170, HDL 44 -Declined Ca  score today; may consider once 45 -Goal LDL <100 -Continue lifestyle modifications  Exercise recommendations: Goal of exercising for at least 30 minutes a day, at least 5 times per week.  Please exercise to a moderate exertion.  This means that while exercising it is difficult to speak in full sentences, however you are not so short of breath that you feel you must stop, and not so comfortable that you can carry on a full conversation.  Exertion level should be approximately a 5/10, if 10 is the most exertion you can perform.  Diet recommendations: Recommend a heart healthy diet such as the Mediterranean diet.  This diet consists of plant based foods, healthy fats, lean meats, olive oil.  It suggests limiting the intake of simple carbohydrates such as white breads, pastries, and pastas.  It also limits the amount of red meat, wine, and dairy products such as cheese that one should consume on a daily basis.   Prefers prn follow-up.      Medication Adjustments/Labs and Tests Ordered: Current medicines are reviewed at length with the patient today.  Concerns regarding medicines are outlined above.  Orders Placed This Encounter  Procedures   EKG 12-Lead   No orders of the defined types were placed in this encounter.   There are no Patient Instructions on file for this visit.    Signed, Meriam Sprague, MD  12/17/2022 10:24 AM    [facility]

## 2022-12-17 ENCOUNTER — Encounter: Payer: Self-pay | Admitting: Cardiology

## 2022-12-17 ENCOUNTER — Ambulatory Visit: Payer: BC Managed Care – PPO | Attending: Cardiology | Admitting: Cardiology

## 2022-12-17 VITALS — BP 128/88 | HR 90 | Ht 68.0 in | Wt 231.0 lb

## 2022-12-17 DIAGNOSIS — E782 Mixed hyperlipidemia: Secondary | ICD-10-CM

## 2022-12-17 MED ORDER — PRAVASTATIN SODIUM 40 MG PO TABS: 40.0000 mg | ORAL_TABLET | Freq: Every day | ORAL | 3 refills | Status: DC

## 2022-12-17 NOTE — Patient Instructions (Signed)
Medication Instructions:   Your physician recommends that you continue on your current medications as directed. Please refer to the Current Medication list given to you today.  *If you need a refill on your cardiac medications before your next appointment, please call your pharmacy*    Follow-Up:  AS NEEDED WITH DR. Epifanio Lesches

## 2023-12-25 ENCOUNTER — Other Ambulatory Visit: Payer: Self-pay

## 2023-12-25 DIAGNOSIS — E782 Mixed hyperlipidemia: Secondary | ICD-10-CM

## 2023-12-25 MED ORDER — PRAVASTATIN SODIUM 40 MG PO TABS: 40.0000 mg | ORAL_TABLET | Freq: Every day | ORAL | 0 refills | Status: AC

## 2024-01-25 ENCOUNTER — Other Ambulatory Visit: Payer: Self-pay | Admitting: Physician Assistant

## 2024-01-25 DIAGNOSIS — E782 Mixed hyperlipidemia: Secondary | ICD-10-CM
